# Patient Record
Sex: Female | Born: 1935 | Race: White | Hispanic: No | Marital: Married | Smoking: Former smoker
Health system: Southern US, Community
[De-identification: ages and names within clinical notes are randomized; demographics above are authoritative.]

## PROBLEM LIST (undated history)

## (undated) DIAGNOSIS — K219 Gastro-esophageal reflux disease without esophagitis: Secondary | ICD-10-CM

## (undated) DIAGNOSIS — I10 Essential (primary) hypertension: Secondary | ICD-10-CM

## (undated) DIAGNOSIS — M25552 Pain in left hip: Secondary | ICD-10-CM

## (undated) DIAGNOSIS — F32A Depression, unspecified: Secondary | ICD-10-CM

## (undated) DIAGNOSIS — F329 Major depressive disorder, single episode, unspecified: Secondary | ICD-10-CM

## (undated) DIAGNOSIS — E785 Hyperlipidemia, unspecified: Secondary | ICD-10-CM

## (undated) DIAGNOSIS — E119 Type 2 diabetes mellitus without complications: Secondary | ICD-10-CM

## (undated) HISTORY — PX: HIATAL HERNIA REPAIR: SHX195

## (undated) HISTORY — PX: ABDOMINAL HYSTERECTOMY: SHX81

## (undated) HISTORY — PX: KNEE ARTHROSCOPY: SUR90

## (undated) HISTORY — PX: CHOLECYSTECTOMY: SHX55

## (undated) HISTORY — PX: PARTIAL COLECTOMY: SHX5273

## (undated) HISTORY — PX: SHOULDER ARTHROSCOPY: SHX128

## (undated) HISTORY — PX: COLONOSCOPY: SHX174

## (undated) HISTORY — PX: COLONOSCOPY WITH ESOPHAGOGASTRODUODENOSCOPY (EGD): SHX5779

---

## 2003-06-10 ENCOUNTER — Other Ambulatory Visit: Payer: Self-pay

## 2004-09-05 ENCOUNTER — Ambulatory Visit: Payer: Self-pay | Admitting: Internal Medicine

## 2005-02-20 ENCOUNTER — Ambulatory Visit: Payer: Self-pay | Admitting: Internal Medicine

## 2005-07-04 ENCOUNTER — Ambulatory Visit: Payer: Self-pay | Admitting: Gastroenterology

## 2005-10-18 ENCOUNTER — Ambulatory Visit: Payer: Self-pay | Admitting: Internal Medicine

## 2006-03-19 ENCOUNTER — Ambulatory Visit: Payer: Self-pay | Admitting: Internal Medicine

## 2006-04-11 ENCOUNTER — Ambulatory Visit: Payer: Self-pay | Admitting: Internal Medicine

## 2007-04-25 ENCOUNTER — Ambulatory Visit: Payer: Self-pay | Admitting: Nurse Practitioner

## 2007-06-25 ENCOUNTER — Other Ambulatory Visit: Payer: Self-pay

## 2007-06-26 ENCOUNTER — Inpatient Hospital Stay: Payer: Self-pay | Admitting: General Surgery

## 2008-05-04 ENCOUNTER — Ambulatory Visit: Payer: Self-pay | Admitting: Nurse Practitioner

## 2008-12-01 IMAGING — MG MM CAD SCREENING MAMMO
1 series · 4 of 4 positions shown · non-contrast
Comparison: none

REASON FOR EXAM: scr mammo
COMMENTS:

[Series 8407: R CC · right · 4 of 4 slices shown]
[im 1/4]
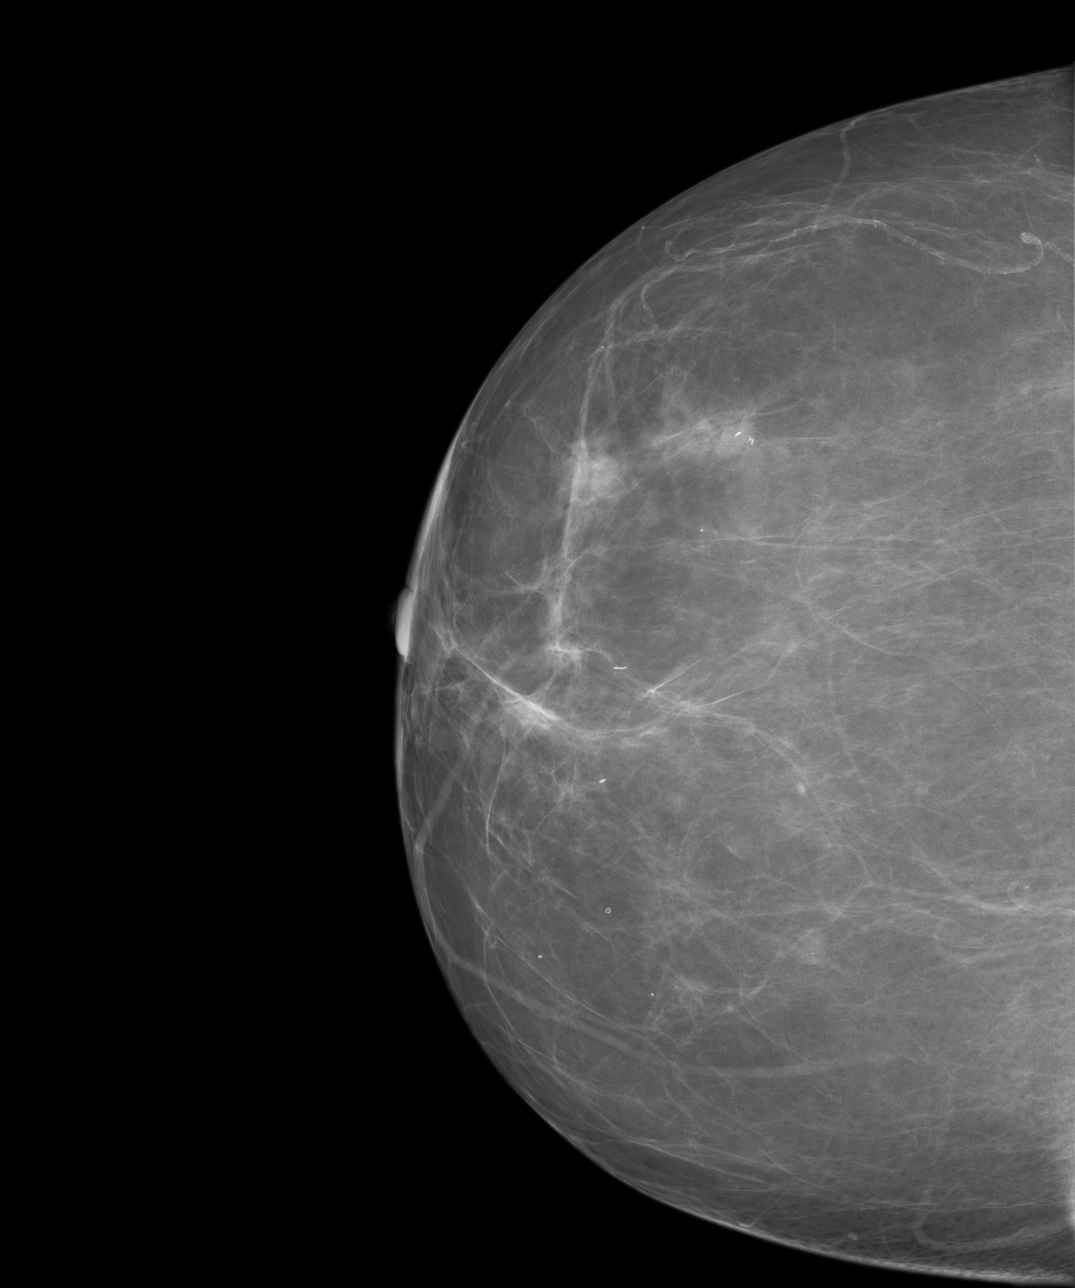
[im 2/4]
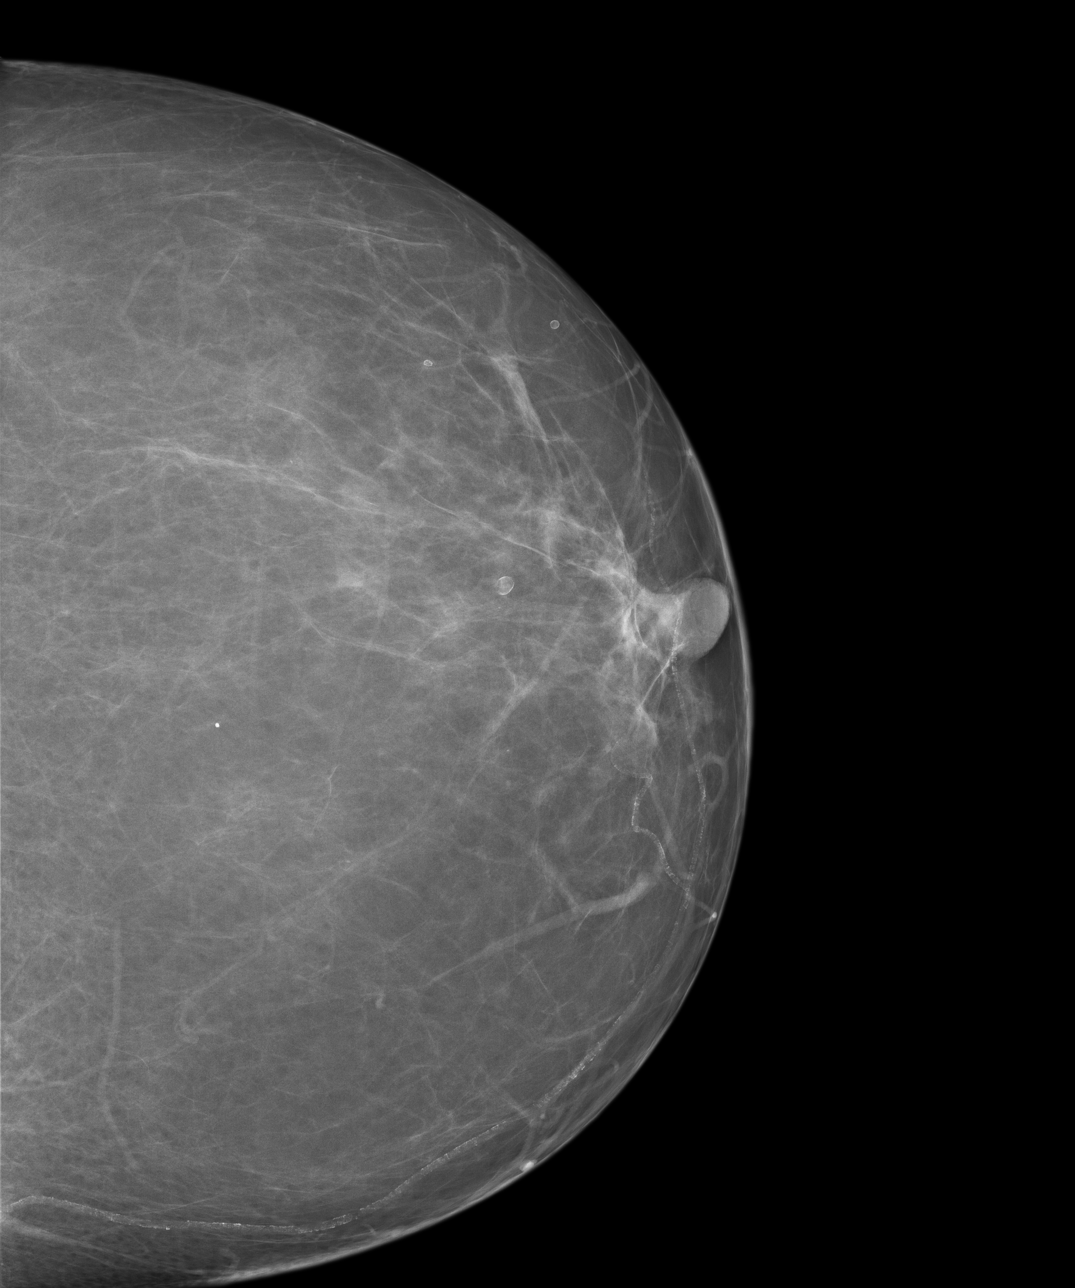
[im 3/4]
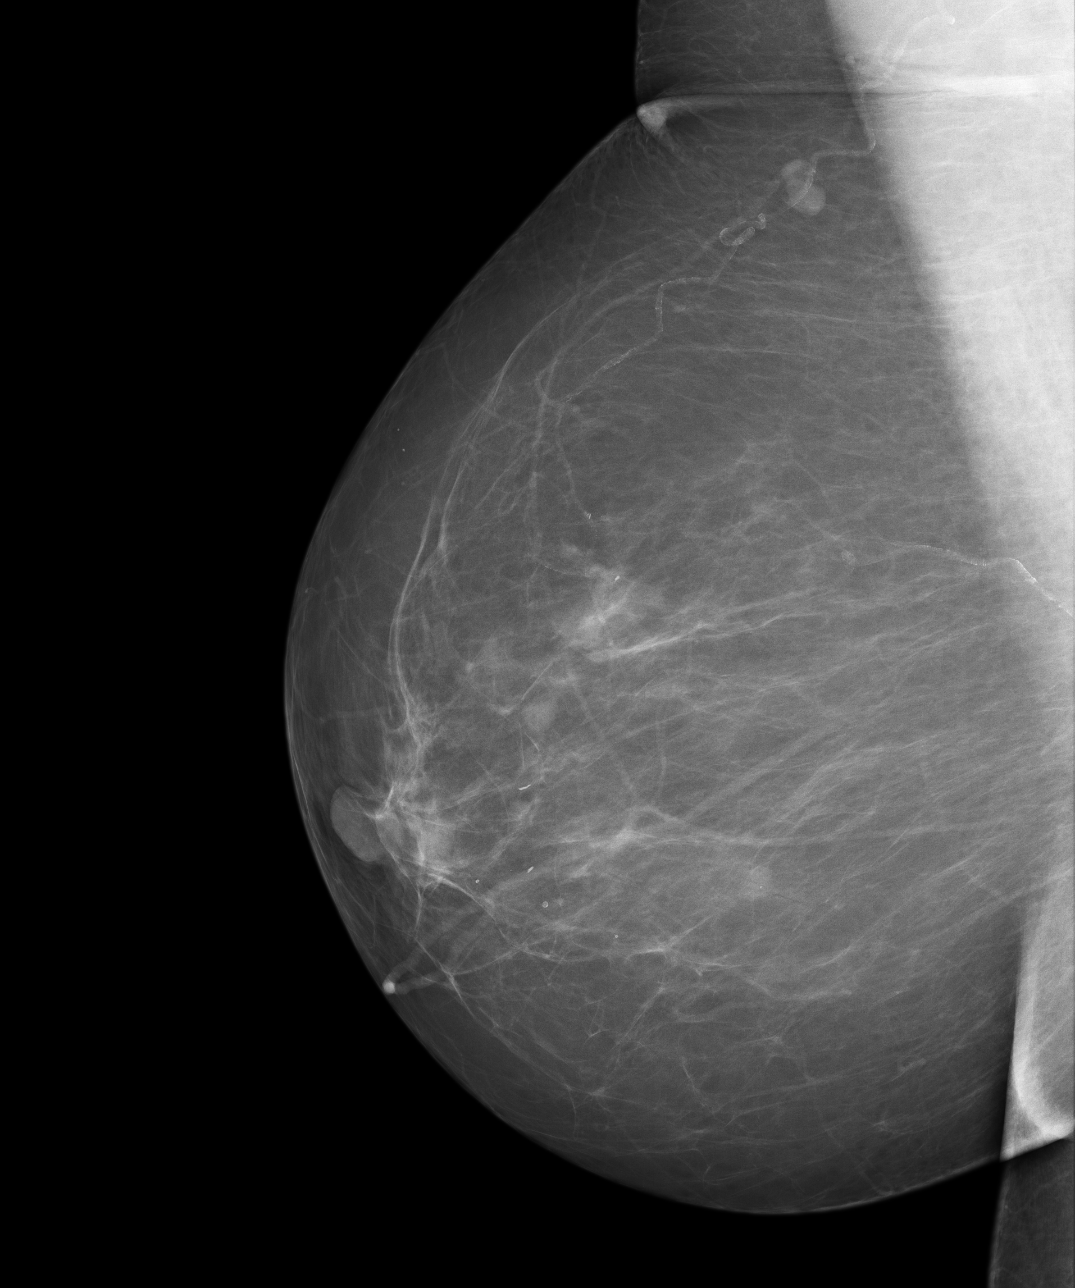
[im 4/4]
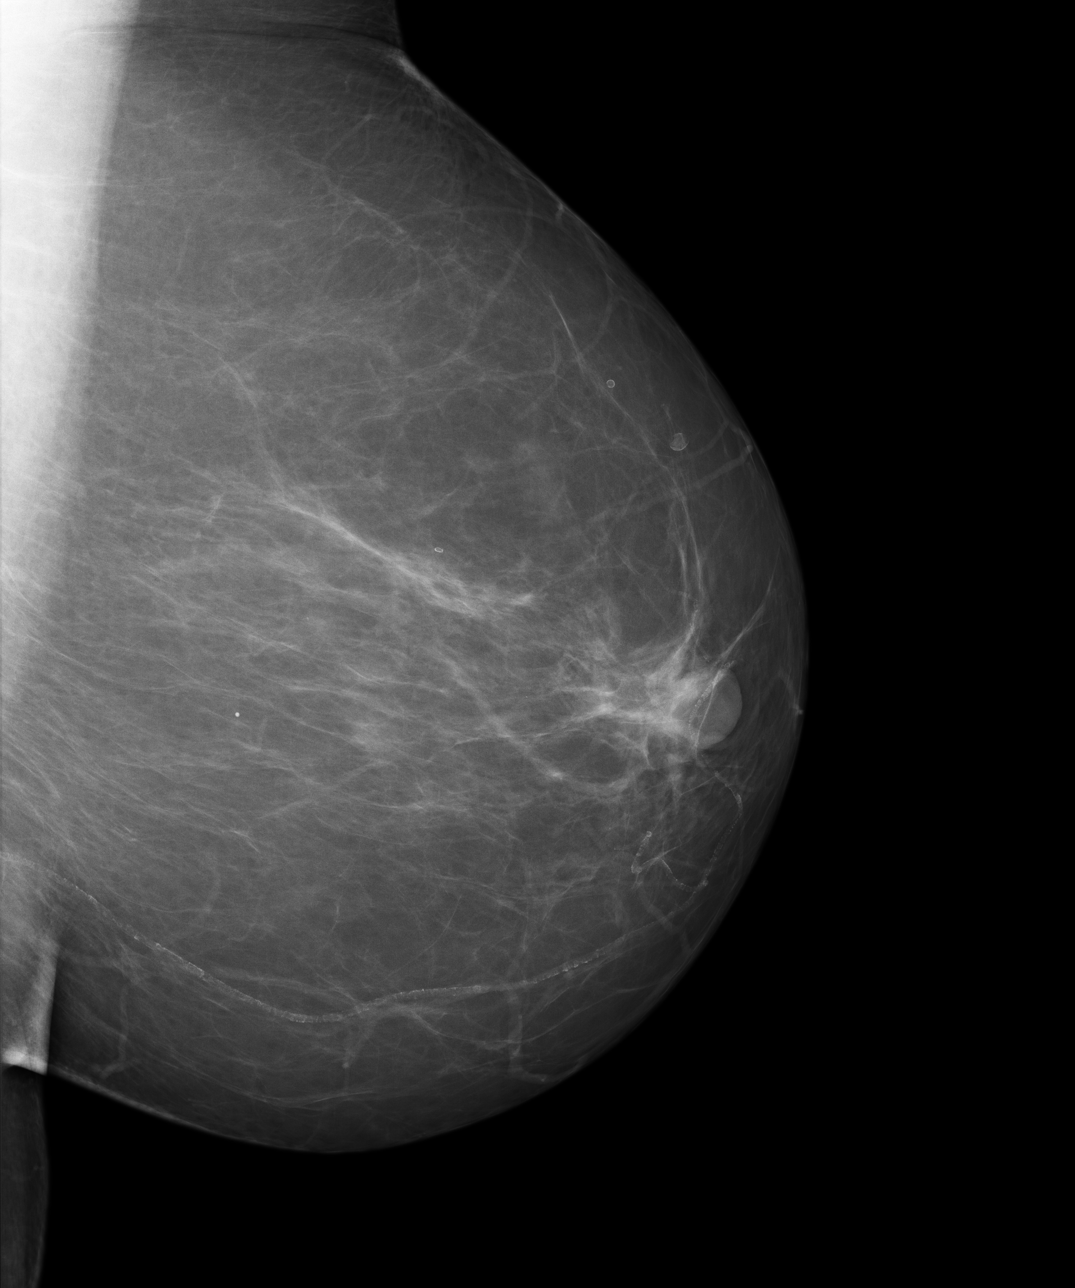

[4 of 4 positions shown; findings below may reference images not displayed]

PROCEDURE:     MAM - MAM DGTL SCREENING MAMMO W/CAD  - April 25, 2007  [DATE]

RESULT:     Comparison is made to a prior study 20 February, 2005 as well as
to a film screen study 19 March, 2006.
The breasts exhibit a symmetric moderately dense parenchymal pattern.  There
is stable density on the RIGHT in the upper outer quadrant. Nodularity in
the axillary region on the RIGHT is present and stable.  There are a few
linear secretory type calcifications present on the RIGHT.  I see no
dominant mass and no malignant appearing grouping of microcalcification.
IMPRESSION: 1.     I do not see findings suspicious for malignancy.
2.     BI-RADS:  Category 2-Benign Finding.

RECOMMENDATION:  Please continue to encourage yearly mammographic followup.

A NEGATIVE MAMMOGRAM REPORT DOES NOT PRECLUDE BIOPSY OR OTHER EVALUATION OF
A CLINICALLY PALPABLE OR OTHERWISE SUPSICIOUS MASS OR LESION.  BREAST CANCER
MAY NOT BE DETECTED BY MAMMOGRAPHY IN UP TO 10% OF CASES.

## 2009-05-10 ENCOUNTER — Ambulatory Visit: Payer: Self-pay | Admitting: Nurse Practitioner

## 2010-05-12 ENCOUNTER — Ambulatory Visit: Payer: Self-pay | Admitting: Nurse Practitioner

## 2010-10-24 ENCOUNTER — Ambulatory Visit: Payer: Self-pay | Admitting: Unknown Physician Specialty

## 2010-11-22 ENCOUNTER — Ambulatory Visit: Payer: Self-pay | Admitting: Family Medicine

## 2010-11-24 ENCOUNTER — Ambulatory Visit: Payer: Self-pay | Admitting: Unknown Physician Specialty

## 2010-11-28 ENCOUNTER — Ambulatory Visit: Payer: Self-pay | Admitting: Unknown Physician Specialty

## 2011-05-23 ENCOUNTER — Ambulatory Visit: Payer: Self-pay

## 2012-05-29 ENCOUNTER — Ambulatory Visit: Payer: Self-pay | Admitting: Family Medicine

## 2013-03-14 ENCOUNTER — Ambulatory Visit: Payer: Self-pay | Admitting: Family Medicine

## 2013-06-04 ENCOUNTER — Ambulatory Visit: Payer: Self-pay | Admitting: Family Medicine

## 2014-06-16 ENCOUNTER — Ambulatory Visit: Payer: Self-pay | Admitting: Pediatrics

## 2015-05-25 ENCOUNTER — Other Ambulatory Visit: Payer: Self-pay | Admitting: Family

## 2015-05-25 ENCOUNTER — Other Ambulatory Visit: Payer: Self-pay | Admitting: Pediatrics

## 2015-05-25 DIAGNOSIS — Z1231 Encounter for screening mammogram for malignant neoplasm of breast: Secondary | ICD-10-CM

## 2015-06-22 ENCOUNTER — Ambulatory Visit
Admission: RE | Admit: 2015-06-22 | Discharge: 2015-06-22 | Disposition: A | Discharge status: Home / Self Care | Payer: Medicare Other | Source: Ambulatory Visit | Attending: Family | Admitting: Family

## 2015-06-22 DIAGNOSIS — Z1231 Encounter for screening mammogram for malignant neoplasm of breast: Secondary | ICD-10-CM | POA: Insufficient documentation

## 2015-09-20 ENCOUNTER — Other Ambulatory Visit
Admission: RE | Admit: 2015-09-20 | Discharge: 2015-09-20 | Disposition: A | Discharge status: Home / Self Care | Payer: Medicare Other | Source: Ambulatory Visit | Attending: Gastroenterology | Admitting: Gastroenterology

## 2015-09-20 DIAGNOSIS — K529 Noninfective gastroenteritis and colitis, unspecified: Secondary | ICD-10-CM | POA: Insufficient documentation

## 2015-09-20 LAB — GASTROINTESTINAL PANEL BY PCR, STOOL (REPLACES STOOL CULTURE)
ADENOVIRUS F40/41: NOT DETECTED
ASTROVIRUS: NOT DETECTED
CAMPYLOBACTER SPECIES: NOT DETECTED
Cryptosporidium: NOT DETECTED
Cyclospora cayetanensis: NOT DETECTED
E. coli O157: NOT DETECTED
ENTAMOEBA HISTOLYTICA: NOT DETECTED
Enteroaggregative E coli (EAEC): NOT DETECTED
Enteropathogenic E coli (EPEC): NOT DETECTED
Enterotoxigenic E coli (ETEC): NOT DETECTED
GIARDIA LAMBLIA: NOT DETECTED
NOROVIRUS GI/GII: NOT DETECTED
PLESIMONAS SHIGELLOIDES: NOT DETECTED
Rotavirus A: NOT DETECTED
SHIGELLA/ENTEROINVASIVE E COLI (EIEC): NOT DETECTED
Salmonella species: NOT DETECTED
Sapovirus (I, II, IV, and V): NOT DETECTED
Shiga like toxin producing E coli (STEC): NOT DETECTED
VIBRIO CHOLERAE: NOT DETECTED
Vibrio species: NOT DETECTED
Yersinia enterocolitica: NOT DETECTED

## 2015-11-15 ENCOUNTER — Other Ambulatory Visit: Payer: Self-pay | Admitting: Orthopedic Surgery

## 2015-11-15 DIAGNOSIS — M5417 Radiculopathy, lumbosacral region: Secondary | ICD-10-CM

## 2015-11-15 DIAGNOSIS — M5441 Lumbago with sciatica, right side: Secondary | ICD-10-CM

## 2015-11-23 ENCOUNTER — Ambulatory Visit: Admission: RE | Admit: 2015-11-23 | Payer: Medicare Other | Source: Ambulatory Visit

## 2015-11-23 ENCOUNTER — Ambulatory Visit
Admission: RE | Admit: 2015-11-23 | Discharge: 2015-11-23 | Disposition: A | Discharge status: Home / Self Care | Payer: Medicare Other | Source: Ambulatory Visit | Attending: Orthopedic Surgery | Admitting: Orthopedic Surgery

## 2015-11-23 DIAGNOSIS — M5441 Lumbago with sciatica, right side: Secondary | ICD-10-CM | POA: Insufficient documentation

## 2015-11-23 DIAGNOSIS — M47896 Other spondylosis, lumbar region: Secondary | ICD-10-CM | POA: Diagnosis not present

## 2015-11-23 DIAGNOSIS — M5417 Radiculopathy, lumbosacral region: Secondary | ICD-10-CM

## 2015-11-23 DIAGNOSIS — K5730 Diverticulosis of large intestine without perforation or abscess without bleeding: Secondary | ICD-10-CM | POA: Diagnosis not present

## 2015-11-23 DIAGNOSIS — N281 Cyst of kidney, acquired: Secondary | ICD-10-CM | POA: Insufficient documentation

## 2015-11-23 DIAGNOSIS — M5137 Other intervertebral disc degeneration, lumbosacral region: Secondary | ICD-10-CM | POA: Diagnosis not present

## 2016-03-27 ENCOUNTER — Encounter: Payer: Self-pay | Admitting: *Deleted

## 2016-03-28 ENCOUNTER — Encounter
Admission: RE | Disposition: A | Discharge status: Home / Self Care | Payer: Self-pay | Source: Ambulatory Visit | Attending: Gastroenterology

## 2016-03-28 ENCOUNTER — Ambulatory Visit: Payer: Medicare Other | Admitting: Anesthesiology

## 2016-03-28 ENCOUNTER — Encounter: Payer: Self-pay | Admitting: *Deleted

## 2016-03-28 ENCOUNTER — Ambulatory Visit
Admission: RE | Admit: 2016-03-28 | Discharge: 2016-03-28 | Disposition: A | Discharge status: Home / Self Care | Payer: Medicare Other | Source: Ambulatory Visit | Attending: Gastroenterology | Admitting: Gastroenterology

## 2016-03-28 DIAGNOSIS — E119 Type 2 diabetes mellitus without complications: Secondary | ICD-10-CM | POA: Insufficient documentation

## 2016-03-28 DIAGNOSIS — F329 Major depressive disorder, single episode, unspecified: Secondary | ICD-10-CM | POA: Insufficient documentation

## 2016-03-28 DIAGNOSIS — Z980 Intestinal bypass and anastomosis status: Secondary | ICD-10-CM | POA: Diagnosis not present

## 2016-03-28 DIAGNOSIS — Z9049 Acquired absence of other specified parts of digestive tract: Secondary | ICD-10-CM | POA: Insufficient documentation

## 2016-03-28 DIAGNOSIS — I10 Essential (primary) hypertension: Secondary | ICD-10-CM | POA: Insufficient documentation

## 2016-03-28 DIAGNOSIS — Z7982 Long term (current) use of aspirin: Secondary | ICD-10-CM | POA: Diagnosis not present

## 2016-03-28 DIAGNOSIS — E785 Hyperlipidemia, unspecified: Secondary | ICD-10-CM | POA: Insufficient documentation

## 2016-03-28 DIAGNOSIS — Z8371 Family history of colonic polyps: Secondary | ICD-10-CM | POA: Diagnosis not present

## 2016-03-28 DIAGNOSIS — Z7984 Long term (current) use of oral hypoglycemic drugs: Secondary | ICD-10-CM | POA: Diagnosis not present

## 2016-03-28 DIAGNOSIS — R197 Diarrhea, unspecified: Secondary | ICD-10-CM | POA: Diagnosis present

## 2016-03-28 DIAGNOSIS — Z87891 Personal history of nicotine dependence: Secondary | ICD-10-CM | POA: Diagnosis not present

## 2016-03-28 DIAGNOSIS — D122 Benign neoplasm of ascending colon: Secondary | ICD-10-CM | POA: Diagnosis not present

## 2016-03-28 DIAGNOSIS — K219 Gastro-esophageal reflux disease without esophagitis: Secondary | ICD-10-CM | POA: Diagnosis not present

## 2016-03-28 DIAGNOSIS — K5730 Diverticulosis of large intestine without perforation or abscess without bleeding: Secondary | ICD-10-CM | POA: Insufficient documentation

## 2016-03-28 HISTORY — DX: Hyperlipidemia, unspecified: E78.5

## 2016-03-28 HISTORY — DX: Pain in left hip: M25.552

## 2016-03-28 HISTORY — DX: Essential (primary) hypertension: I10

## 2016-03-28 HISTORY — DX: Depression, unspecified: F32.A

## 2016-03-28 HISTORY — DX: Major depressive disorder, single episode, unspecified: F32.9

## 2016-03-28 HISTORY — DX: Gastro-esophageal reflux disease without esophagitis: K21.9

## 2016-03-28 HISTORY — PX: COLONOSCOPY WITH PROPOFOL: SHX5780

## 2016-03-28 HISTORY — DX: Type 2 diabetes mellitus without complications: E11.9

## 2016-03-28 LAB — GLUCOSE, CAPILLARY: Glucose-Capillary: 190 mg/dL — ABNORMAL HIGH (ref 65–99)

## 2016-03-28 SURGERY — COLONOSCOPY WITH PROPOFOL
Anesthesia: General

## 2016-03-28 MED ORDER — MIDAZOLAM HCL 5 MG/5ML IJ SOLN
INTRAMUSCULAR | Status: DC | PRN
  Administered 2016-03-28: 1 mg via INTRAVENOUS

## 2016-03-28 MED ORDER — FENTANYL CITRATE (PF) 100 MCG/2ML IJ SOLN
INTRAMUSCULAR | Status: DC | PRN
  Administered 2016-03-28: 50 ug via INTRAVENOUS

## 2016-03-28 MED ORDER — SODIUM CHLORIDE 0.9 % IV SOLN: INTRAVENOUS | Status: DC

## 2016-03-28 MED ORDER — SODIUM CHLORIDE 0.9 % IV SOLN
INTRAVENOUS | Status: DC
  Administered 2016-03-28: 11:00:00 via INTRAVENOUS

## 2016-03-28 MED ORDER — PROPOFOL 10 MG/ML IV BOLUS
INTRAVENOUS | Status: DC | PRN
  Administered 2016-03-28: 80 mg via INTRAVENOUS

## 2016-03-28 MED ORDER — LIDOCAINE 2% (20 MG/ML) 5 ML SYRINGE
INTRAMUSCULAR | Status: DC | PRN
  Administered 2016-03-28: 30 mg via INTRAVENOUS

## 2016-03-28 MED ORDER — PHENYLEPHRINE HCL 10 MG/ML IJ SOLN
INTRAMUSCULAR | Status: DC | PRN
  Administered 2016-03-28 (×2): 100 ug via INTRAVENOUS

## 2016-03-28 MED ORDER — PROPOFOL 500 MG/50ML IV EMUL
INTRAVENOUS | Status: DC | PRN
  Administered 2016-03-28: 100 ug/kg/min via INTRAVENOUS

## 2016-03-28 NOTE — Anesthesia Preprocedure Evaluation (Signed)
Anesthesia Evaluation  Patient identified by MRN, date of birth, ID band Patient awake    Reviewed: Allergy & Precautions, H&P , NPO status , Patient's Chart, lab work & pertinent test results, reviewed documented beta blocker date and time   Airway Mallampati: II   Neck ROM: full    Dental  (+) Poor Dentition   Pulmonary neg pulmonary ROS, former smoker,    Pulmonary exam normal        Cardiovascular hypertension, negative cardio ROS Normal cardiovascular exam     Neuro/Psych PSYCHIATRIC DISORDERS negative neurological ROS  negative psych ROS   GI/Hepatic negative GI ROS, Neg liver ROS, GERD  Medicated,  Endo/Other  negative endocrine ROSdiabetes  Renal/GU negative Renal ROS  negative genitourinary   Musculoskeletal   Abdominal   Peds  Hematology negative hematology ROS (+)   Anesthesia Other Findings Past Medical History: No date: Depression No date: Diabetes mellitus without complication (HCC) No date: GERD (gastroesophageal reflux disease) No date: Hyperlipidemia No date: Hypertension No date: Pain in left hip Past Surgical History: No date: ABDOMINAL HYSTERECTOMY No date: CHOLECYSTECTOMY No date: COLONOSCOPY No date: COLONOSCOPY WITH ESOPHAGOGASTRODUODENOSCOPY (E* No date: HIATAL HERNIA REPAIR No date: KNEE ARTHROSCOPY No date: PARTIAL COLECTOMY No date: SHOULDER ARTHROSCOPY BMI    Body Mass Index:  25.58 kg/m     Reproductive/Obstetrics                             Anesthesia Physical Anesthesia Plan  ASA: III  Anesthesia Plan: General   Post-op Pain Management:    Induction:   Airway Management Planned:   Additional Equipment:   Intra-op Plan:   Post-operative Plan:   Informed Consent: I have reviewed the patients History and Physical, chart, labs and discussed the procedure including the risks, benefits and alternatives for the proposed anesthesia with the  patient or authorized representative who has indicated his/her understanding and acceptance.   Dental Advisory Given  Plan Discussed with: CRNA  Anesthesia Plan Comments:         Anesthesia Quick Evaluation

## 2016-03-28 NOTE — Op Note (Signed)
[facility] Gastroenterology Patient Name: Teresa Gibson Procedure Date: 03/28/2016 11:08 AM MRN: [id number] Account #: [id number] Date of Birth: Jul 24, 1933 Admit Type: Outpatient Age: 82 Room: [facility] ENDO ROOM 3 Gender: Female Note Status: Finalized Procedure:            Colonoscopy Indications:          Chronic diarrhea Providers:            Lollie Sails, MD Referring MD:         Warren Danes (Referring MD) Medicines:            Monitored Anesthesia Care Complications:        No immediate complications. Procedure:            Pre-Anesthesia Assessment:                       - ASA Grade Assessment: III - A patient with severe                        systemic disease.                       After obtaining informed consent, the colonoscope was                        passed under direct vision. Throughout the procedure,                        the patient's blood pressure, pulse, and oxygen                        saturations were monitored continuously. The                        Colonoscope was introduced through the anus and                        advanced to the the cecum, identified by appendiceal                        orifice and ileocecal valve. The colonoscopy was                        performed with moderate difficulty due to multiple                        diverticula in the colon. The patient tolerated the                        procedure well. The quality of the bowel preparation                        was good. Findings:      Two sessile polyps were found in the ascending colon. The polyps were 2       to 3 mm in size. These polyps were removed with a cold biopsy forceps.       Resection and retrieval were complete.      Multiple small and large-mouthed diverticula were found in the sigmoid       colon, descending colon, transverse colon and ascending colon.  There was evidence of a prior functional end-to-end colo-colonic   anastomosis at 9 cm proximal to the anus. This was patent and was       characterized by healthy appearing mucosa. The anastomosis was traversed.      The digital rectal exam was normal.      The retroflexed view of the distal rectum and anal verge was normal and       showed no anal or rectal abnormalities. Impression:           - Two 2 to 3 mm polyps in the ascending colon, removed                        with a cold biopsy forceps. Resected and retrieved.                       - Diverticulosis in the sigmoid colon, in the                        descending colon, in the transverse colon and in the                        ascending colon.                       - Patent functional end-to-end colo-colonic                        anastomosis, characterized by healthy appearing mucosa.                       - The distal rectum and anal verge are normal on                        retroflexion view. Recommendation:       - Discharge patient to home. Procedure Code(s):    --- Professional ---                       [phone], Colonoscopy, flexible; with biopsy, single or                        multiple Diagnosis Code(s):    --- Professional ---                       D12.2, Benign neoplasm of ascending colon                       Z98.0, Intestinal bypass and anastomosis status                       K52.9, Noninfective gastroenteritis and colitis,                        unspecified                       K57.30, Diverticulosis of large intestine without                        perforation or abscess without bleeding CPT copyright 2016 American [employer]. All rights reserved. The codes documented in this report are preliminary and upon coder review may  be revised to meet current compliance requirements. Lollie Sails, MD 03/28/2016 11:46:44 AM This report has been signed electronically. Number of Addenda: 0 Note Initiated On: 03/28/2016 11:08 AM Scope Withdrawal Time: 0 hours 18 minutes 27  seconds  Total Procedure Duration: 0 hours 25 minutes 56 seconds       [facility]

## 2016-03-28 NOTE — H&P (Signed)
Outpatient short stay form Pre-procedure 03/28/2016 11:04 AM Teresa Sails MD  Primary Physician: Dr. Trecia Rogers  Reason for visit:  Colonoscopy  History of present illness:  Patient is a 82 year old female presenting today as above. She has a personal history of diarrhea seems to be getting worse, this seems to of started when she had a surgery in 2009 for perforated diverticulum. This required a colostomy at the time then followed by reversal. She does occasionally see some bright red blood with her stool. She does take 81 mg aspirin (held that for several days. She takes no other aspirin products or blood thinning agents. She tolerated her prep well.  Along with her loose stools or some occasional fecal incontinence. Further she has a history of taking metformin, also a history of cholecystectomy about 10-15 years ago. She states that her abdominal pain is across the lower abdomen but much more so in the right lower quadrant.    Current Facility-Administered Medications:  .  0.9 %  sodium chloride infusion, , Intravenous, Continuous, Teresa Sails, MD, Last Rate: 20 mL/hr at 03/28/16 1041 .  0.9 %  sodium chloride infusion, , Intravenous, Continuous, Teresa Sails, MD  Prescriptions Prior to Admission  Medication Sig Dispense Refill Last Dose  . aspirin 81 MG chewable tablet Chew by mouth daily.   03/25/2016  . atorvastatin (LIPITOR) 10 MG tablet Take 10 mg by mouth daily.     Marland Kitchen dicyclomine (BENTYL) 10 MG capsule Take 10 mg by mouth 3 (three) times daily as needed for spasms.     Marland Kitchen gabapentin (NEURONTIN) 100 MG capsule Take 100 mg by mouth at bedtime.     Marland Kitchen glipiZIDE (GLUCOTROL) 5 MG tablet Take by mouth daily before breakfast.   03/27/2016 at Unknown time  . hydrochlorothiazide (HYDRODIURIL) 25 MG tablet Take 25 mg by mouth daily.     Marland Kitchen lisinopril (PRINIVIL,ZESTRIL) 20 MG tablet Take 20 mg by mouth daily.   03/28/2016 at 0730  . LORazepam (ATIVAN) 0.5 MG tablet Take  0.5 mg by mouth as needed for anxiety.   03/25/2016  . meloxicam (MOBIC) 7.5 MG tablet Take 7.5 mg by mouth daily.     . metFORMIN (GLUCOPHAGE) 1000 MG tablet Take 1,000 mg by mouth 2 (two) times daily with a meal.     . metoprolol tartrate (LOPRESSOR) 25 MG tablet Take 25 mg by mouth 2 (two) times daily.   03/28/2016 at 0730  . Multiple Vitamin (MULTIVITAMIN) tablet Take 1 tablet by mouth daily.     Marland Kitchen omeprazole (PRILOSEC) 20 MG capsule Take 20 mg by mouth daily.   03/26/2016  . traMADol (ULTRAM) 50 MG tablet Take by mouth every 6 (six) hours as needed.   03/24/2016     Allergies  Allergen Reactions  . Contrast Media [Iodinated Diagnostic Agents]      Past Medical History:  Diagnosis Date  . Depression   . Diabetes mellitus without complication ([place])   . GERD (gastroesophageal reflux disease)   . Hyperlipidemia   . Hypertension   . Pain in left hip     Review of systems:      Physical Exam    Heart and lungs: Regular rate and rhythm without rub or gallop, lungs are bilaterally clear.    HEENT: Normocephalic atraumatic eyes are anicteric    Other: Soft nontender nondistended bowel sounds positive normoactive    Pertinant exam for procedure: Soft nontender nondistended bowel sounds positive normoactive.  Planned proceedures:  Colonoscopy and indicated procedures. I have discussed the risks benefits and complications of procedures to include not limited to bleeding, infection, perforation and the risk of sedation and the patient wishes to proceed.    Teresa Sails, MD Gastroenterology 03/28/2016  11:04 AM

## 2016-03-28 NOTE — Transfer of Care (Signed)
Immediate Anesthesia Transfer of Care Note  Patient: Teresa Gibson  Procedure(s) Performed: Procedure(s): COLONOSCOPY WITH PROPOFOL (N/A)  Patient Location: PACU and Endoscopy Unit  Anesthesia Type:General  Level of Consciousness: oriented  Airway & Oxygen Therapy: Patient Spontanous Breathing and Patient connected to nasal cannula oxygen  Post-op Assessment: Report given to RN and Post -op Vital signs reviewed and stable  Post vital signs: Reviewed and stable  Last Vitals:  Vitals:   03/28/16 1017  BP: (!) 185/95  Pulse: (!) 104  Resp: 18  Temp: 36.7 C    Last Pain:  Vitals:   03/28/16 1017  TempSrc: Tympanic         Complications: No apparent anesthesia complications

## 2016-03-29 ENCOUNTER — Encounter: Payer: Self-pay | Admitting: Gastroenterology

## 2016-03-29 LAB — SURGICAL PATHOLOGY

## 2016-03-30 NOTE — Anesthesia Postprocedure Evaluation (Signed)
Anesthesia Post Note  Patient: Teresa Gibson  Procedure(s) Performed: Procedure(s) (LRB): COLONOSCOPY WITH PROPOFOL (N/A)  Patient location during evaluation: PACU Anesthesia Type: General Level of consciousness: awake and alert Pain management: pain level controlled Vital Signs Assessment: post-procedure vital signs reviewed and stable Respiratory status: spontaneous breathing, nonlabored ventilation, respiratory function stable and patient connected to nasal cannula oxygen Cardiovascular status: blood pressure returned to baseline and stable Postop Assessment: no signs of nausea or vomiting Anesthetic complications: no    Last Vitals:  Vitals:   03/28/16 1206 03/28/16 1217  BP: 134/79 139/81  Pulse: 77 (!) 101  Resp: 15 (!) 21  Temp:      Last Pain:  Vitals:   03/29/16 0800  TempSrc:   PainSc: 0-No pain                 Molli Barrows

## 2016-06-07 ENCOUNTER — Other Ambulatory Visit: Payer: Self-pay | Admitting: Family

## 2016-06-07 DIAGNOSIS — Z1239 Encounter for other screening for malignant neoplasm of breast: Secondary | ICD-10-CM

## 2017-05-24 ENCOUNTER — Ambulatory Visit
Admission: RE | Admit: 2017-05-24 | Discharge: 2017-05-24 | Disposition: A | Discharge status: Home / Self Care | Payer: Medicare Other | Source: Ambulatory Visit | Attending: Family | Admitting: Family

## 2017-05-24 DIAGNOSIS — Z1231 Encounter for screening mammogram for malignant neoplasm of breast: Secondary | ICD-10-CM | POA: Insufficient documentation

## 2017-05-24 DIAGNOSIS — Z1239 Encounter for other screening for malignant neoplasm of breast: Secondary | ICD-10-CM

## 2017-08-09 ENCOUNTER — Other Ambulatory Visit
Admission: RE | Admit: 2017-08-09 | Discharge: 2017-08-09 | Disposition: A | Discharge status: Home / Self Care | Payer: Medicare Other | Source: Ambulatory Visit | Attending: Gastroenterology | Admitting: Gastroenterology

## 2017-08-09 DIAGNOSIS — R197 Diarrhea, unspecified: Secondary | ICD-10-CM | POA: Insufficient documentation

## 2017-08-09 LAB — GASTROINTESTINAL PANEL BY PCR, STOOL (REPLACES STOOL CULTURE)
ADENOVIRUS F40/41: NOT DETECTED
ASTROVIRUS: NOT DETECTED
CAMPYLOBACTER SPECIES: NOT DETECTED
CYCLOSPORA CAYETANENSIS: NOT DETECTED
Cryptosporidium: NOT DETECTED
ENTEROPATHOGENIC E COLI (EPEC): NOT DETECTED
ENTEROTOXIGENIC E COLI (ETEC): NOT DETECTED
Entamoeba histolytica: NOT DETECTED
Enteroaggregative E coli (EAEC): NOT DETECTED
Giardia lamblia: NOT DETECTED
Norovirus GI/GII: NOT DETECTED
Plesimonas shigelloides: NOT DETECTED
ROTAVIRUS A: NOT DETECTED
SAPOVIRUS (I, II, IV, AND V): NOT DETECTED
SHIGA LIKE TOXIN PRODUCING E COLI (STEC): NOT DETECTED
Salmonella species: NOT DETECTED
Shigella/Enteroinvasive E coli (EIEC): NOT DETECTED
Vibrio cholerae: NOT DETECTED
Vibrio species: NOT DETECTED
Yersinia enterocolitica: NOT DETECTED

## 2017-08-09 LAB — C DIFFICILE QUICK SCREEN W PCR REFLEX
C DIFFICILE (CDIFF) INTERP: NOT DETECTED
C DIFFICLE (CDIFF) ANTIGEN: NEGATIVE
C Diff toxin: NEGATIVE

## 2017-08-20 ENCOUNTER — Other Ambulatory Visit: Payer: Self-pay | Admitting: Gastroenterology

## 2017-08-20 DIAGNOSIS — R1031 Right lower quadrant pain: Secondary | ICD-10-CM

## 2017-08-20 DIAGNOSIS — R1032 Left lower quadrant pain: Principal | ICD-10-CM

## 2017-08-21 ENCOUNTER — Ambulatory Visit
Admission: RE | Admit: 2017-08-21 | Discharge: 2017-08-21 | Disposition: A | Discharge status: Home / Self Care | Payer: Medicare Other | Source: Ambulatory Visit | Attending: Gastroenterology | Admitting: Gastroenterology

## 2017-08-21 ENCOUNTER — Other Ambulatory Visit: Payer: Self-pay | Admitting: Gastroenterology

## 2017-08-21 DIAGNOSIS — R1032 Left lower quadrant pain: Principal | ICD-10-CM

## 2017-08-21 DIAGNOSIS — R1031 Right lower quadrant pain: Secondary | ICD-10-CM | POA: Insufficient documentation

## 2017-09-19 ENCOUNTER — Encounter: Payer: Self-pay | Admitting: *Deleted

## 2017-09-20 ENCOUNTER — Ambulatory Visit: Payer: Medicare Other | Admitting: Anesthesiology

## 2017-09-20 ENCOUNTER — Encounter
Admission: RE | Disposition: A | Discharge status: Home / Self Care | Payer: Self-pay | Source: Ambulatory Visit | Attending: Gastroenterology

## 2017-09-20 ENCOUNTER — Encounter: Payer: Self-pay | Admitting: *Deleted

## 2017-09-20 ENCOUNTER — Other Ambulatory Visit: Payer: Self-pay

## 2017-09-20 ENCOUNTER — Ambulatory Visit
Admission: RE | Admit: 2017-09-20 | Discharge: 2017-09-20 | Disposition: A | Discharge status: Home / Self Care | Payer: Medicare Other | Source: Ambulatory Visit | Attending: Gastroenterology | Admitting: Gastroenterology

## 2017-09-20 DIAGNOSIS — K210 Gastro-esophageal reflux disease with esophagitis: Secondary | ICD-10-CM | POA: Diagnosis not present

## 2017-09-20 DIAGNOSIS — Z79899 Other long term (current) drug therapy: Secondary | ICD-10-CM | POA: Insufficient documentation

## 2017-09-20 DIAGNOSIS — F329 Major depressive disorder, single episode, unspecified: Secondary | ICD-10-CM | POA: Insufficient documentation

## 2017-09-20 DIAGNOSIS — Z7984 Long term (current) use of oral hypoglycemic drugs: Secondary | ICD-10-CM | POA: Insufficient documentation

## 2017-09-20 DIAGNOSIS — Z888 Allergy status to other drugs, medicaments and biological substances status: Secondary | ICD-10-CM | POA: Insufficient documentation

## 2017-09-20 DIAGNOSIS — Z90710 Acquired absence of both cervix and uterus: Secondary | ICD-10-CM | POA: Diagnosis not present

## 2017-09-20 DIAGNOSIS — D122 Benign neoplasm of ascending colon: Secondary | ICD-10-CM | POA: Diagnosis not present

## 2017-09-20 DIAGNOSIS — K921 Melena: Secondary | ICD-10-CM | POA: Insufficient documentation

## 2017-09-20 DIAGNOSIS — Z7982 Long term (current) use of aspirin: Secondary | ICD-10-CM | POA: Insufficient documentation

## 2017-09-20 DIAGNOSIS — D509 Iron deficiency anemia, unspecified: Secondary | ICD-10-CM | POA: Diagnosis not present

## 2017-09-20 DIAGNOSIS — Z86010 Personal history of colonic polyps: Secondary | ICD-10-CM | POA: Insufficient documentation

## 2017-09-20 DIAGNOSIS — E785 Hyperlipidemia, unspecified: Secondary | ICD-10-CM | POA: Diagnosis not present

## 2017-09-20 DIAGNOSIS — Z87891 Personal history of nicotine dependence: Secondary | ICD-10-CM | POA: Diagnosis not present

## 2017-09-20 DIAGNOSIS — Z980 Intestinal bypass and anastomosis status: Secondary | ICD-10-CM | POA: Insufficient documentation

## 2017-09-20 DIAGNOSIS — Z91041 Radiographic dye allergy status: Secondary | ICD-10-CM | POA: Diagnosis not present

## 2017-09-20 DIAGNOSIS — K449 Diaphragmatic hernia without obstruction or gangrene: Secondary | ICD-10-CM | POA: Insufficient documentation

## 2017-09-20 DIAGNOSIS — D124 Benign neoplasm of descending colon: Secondary | ICD-10-CM | POA: Diagnosis not present

## 2017-09-20 DIAGNOSIS — I10 Essential (primary) hypertension: Secondary | ICD-10-CM | POA: Diagnosis not present

## 2017-09-20 DIAGNOSIS — Z8489 Family history of other specified conditions: Secondary | ICD-10-CM | POA: Diagnosis not present

## 2017-09-20 DIAGNOSIS — Z8371 Family history of colonic polyps: Secondary | ICD-10-CM | POA: Diagnosis not present

## 2017-09-20 DIAGNOSIS — E119 Type 2 diabetes mellitus without complications: Secondary | ICD-10-CM | POA: Diagnosis not present

## 2017-09-20 DIAGNOSIS — K644 Residual hemorrhoidal skin tags: Secondary | ICD-10-CM | POA: Diagnosis not present

## 2017-09-20 DIAGNOSIS — K5750 Diverticulosis of both small and large intestine without perforation or abscess without bleeding: Secondary | ICD-10-CM | POA: Diagnosis not present

## 2017-09-20 DIAGNOSIS — Z9049 Acquired absence of other specified parts of digestive tract: Secondary | ICD-10-CM | POA: Diagnosis not present

## 2017-09-20 DIAGNOSIS — Z8379 Family history of other diseases of the digestive system: Secondary | ICD-10-CM | POA: Insufficient documentation

## 2017-09-20 HISTORY — PX: ESOPHAGOGASTRODUODENOSCOPY (EGD) WITH PROPOFOL: SHX5813

## 2017-09-20 HISTORY — PX: COLONOSCOPY WITH PROPOFOL: SHX5780

## 2017-09-20 LAB — GLUCOSE, CAPILLARY: Glucose-Capillary: 198 mg/dL — ABNORMAL HIGH (ref 65–99)

## 2017-09-20 SURGERY — ESOPHAGOGASTRODUODENOSCOPY (EGD) WITH PROPOFOL
Anesthesia: General

## 2017-09-20 MED ORDER — PROPOFOL 10 MG/ML IV BOLUS
INTRAVENOUS | Status: AC
  Filled 2017-09-20: qty 80

## 2017-09-20 MED ORDER — LIDOCAINE HCL (PF) 2 % IJ SOLN
INTRAMUSCULAR | Status: AC
  Filled 2017-09-20: qty 10

## 2017-09-20 MED ORDER — PHENYLEPHRINE HCL 10 MG/ML IJ SOLN
INTRAMUSCULAR | Status: DC | PRN
  Administered 2017-09-20 (×2): 100 ug via INTRAVENOUS
  Administered 2017-09-20: 200 ug via INTRAVENOUS

## 2017-09-20 MED ORDER — PROPOFOL 500 MG/50ML IV EMUL
INTRAVENOUS | Status: DC | PRN
  Administered 2017-09-20: 100 ug/kg/min via INTRAVENOUS

## 2017-09-20 MED ORDER — SODIUM CHLORIDE 0.9 % IV SOLN: INTRAVENOUS | Status: DC

## 2017-09-20 MED ORDER — SODIUM CHLORIDE 0.9 % IV SOLN
INTRAVENOUS | Status: DC
  Administered 2017-09-20: 08:00:00 via INTRAVENOUS

## 2017-09-20 MED ORDER — METOPROLOL TARTRATE 25 MG PO TABS
ORAL_TABLET | ORAL | Status: AC
  Administered 2017-09-20: 25 mg
  Filled 2017-09-20: qty 1

## 2017-09-20 MED ORDER — PHENYLEPHRINE HCL 10 MG/ML IJ SOLN
INTRAMUSCULAR | Status: AC
  Filled 2017-09-20: qty 1

## 2017-09-20 MED ORDER — PROPOFOL 10 MG/ML IV BOLUS
INTRAVENOUS | Status: AC
  Filled 2017-09-20: qty 40

## 2017-09-20 MED ORDER — LIDOCAINE HCL (CARDIAC) 20 MG/ML IV SOLN
INTRAVENOUS | Status: DC | PRN
  Administered 2017-09-20: 50 mg via INTRAVENOUS

## 2017-09-20 MED ORDER — PROPOFOL 10 MG/ML IV BOLUS
INTRAVENOUS | Status: DC | PRN
  Administered 2017-09-20 (×5): 50 mg via INTRAVENOUS

## 2017-09-20 NOTE — Anesthesia Postprocedure Evaluation (Signed)
Anesthesia Post Note  Patient: Writer  Procedure(s) Performed: ESOPHAGOGASTRODUODENOSCOPY (EGD) WITH PROPOFOL (N/A ) COLONOSCOPY WITH PROPOFOL (N/A )  Patient location during evaluation: Endoscopy Anesthesia Type: General Level of consciousness: awake, awake and alert, oriented and patient cooperative Pain management: satisfactory to patient Vital Signs Assessment: post-procedure vital signs reviewed and stable Respiratory status: spontaneous breathing Cardiovascular status: blood pressure returned to baseline Postop Assessment: no headache, no backache, no apparent nausea or vomiting and adequate PO intake Anesthetic complications: no     Last Vitals:  Vitals:   09/20/17 0805 09/20/17 0939  BP: (!) 165/98 (!) 113/52  Pulse: (!) 111 72  Resp: 16 19  Temp: (!) 35.9 C (!) 36 C  SpO2: 99% 100%    Last Pain:  Vitals:   09/20/17 0939  TempSrc: Tympanic                 Leomar Westberg H Bawi Lakins

## 2017-09-20 NOTE — Anesthesia Postprocedure Evaluation (Signed)
Anesthesia Post Note  Patient: Teresa Gibson  Procedure(s) Performed: ESOPHAGOGASTRODUODENOSCOPY (EGD) WITH PROPOFOL (N/A ) COLONOSCOPY WITH PROPOFOL (N/A )  Patient location during evaluation: Endoscopy Anesthesia Type: General Level of consciousness: awake and alert Pain management: pain level controlled Vital Signs Assessment: post-procedure vital signs reviewed and stable Respiratory status: spontaneous breathing and respiratory function stable Cardiovascular status: stable Anesthetic complications: no     Last Vitals:  Vitals:   09/20/17 0805 09/20/17 0939  BP: (!) 165/98 (!) 113/52  Pulse: (!) 111 72  Resp: 16 19  Temp: (!) 35.9 C (!) 36 C  SpO2: 99% 100%    Last Pain:  Vitals:   09/20/17 0939  TempSrc: Tympanic                 KEPHART,WILLIAM K

## 2017-09-20 NOTE — Anesthesia Preprocedure Evaluation (Signed)
Anesthesia Evaluation  Patient identified by MRN, date of birth, ID band Patient awake    Reviewed: Allergy & Precautions, NPO status , Patient's Chart, lab work & pertinent test results, reviewed documented beta blocker date and time   History of Anesthesia Complications Negative for: history of anesthetic complications  Airway Mallampati: III       Dental  (+) Partial Upper   Pulmonary neg sleep apnea, neg COPD, former smoker,           Cardiovascular hypertension, Pt. on medications and Pt. on home beta blockers (-) Past MI and (-) CHF (-) dysrhythmias (-) Valvular Problems/Murmurs     Neuro/Psych neg Seizures Depression    GI/Hepatic Neg liver ROS, GERD  Medicated and Poorly Controlled,  Endo/Other  diabetes, Type 2, Oral Hypoglycemic Agents  Renal/GU negative Renal ROS     Musculoskeletal   Abdominal   Peds  Hematology   Anesthesia Other Findings   Reproductive/Obstetrics                             Anesthesia Physical Anesthesia Plan  ASA: III  Anesthesia Plan: General   Post-op Pain Management:    Induction: Intravenous  PONV Risk Score and Plan: 3 and TIVA, Propofol infusion, Midazolam and Treatment may vary due to age or medical condition  Airway Management Planned: Nasal Cannula  Additional Equipment:   Intra-op Plan:   Post-operative Plan:   Informed Consent: I have reviewed the patients History and Physical, chart, labs and discussed the procedure including the risks, benefits and alternatives for the proposed anesthesia with the patient or authorized representative who has indicated his/her understanding and acceptance.     Plan Discussed with:   Anesthesia Plan Comments:         Anesthesia Quick Evaluation

## 2017-09-20 NOTE — Op Note (Signed)
[facility] Gastroenterology Patient Name: Teresa Gibson Procedure Date: 09/20/2017 8:28 AM MRN: [id number] Account #: [id number] Date of Birth: 1933/05/27 Admit Type: Outpatient Age: 84 Room: [facility] ENDO ROOM 1 Gender: Female Note Status: Finalized Procedure:            Upper GI endoscopy Indications:          Iron deficiency anemia, Gastro-esophageal reflux                        disease, Heme positive stool Providers:            Lollie Sails, MD Referring MD:         Warren Danes (Referring MD) Medicines:            Monitored Anesthesia Care Complications:        No immediate complications. Procedure:            Pre-Anesthesia Assessment:                       - ASA Grade Assessment: III - A patient with severe                        systemic disease.                       After obtaining informed consent, the endoscope was                        passed under direct vision. Throughout the procedure,                        the patient's blood pressure, pulse, and oxygen                        saturations were monitored continuously. The Endoscope                        was introduced through the mouth, and advanced to the                        third part of duodenum. The upper GI endoscopy was                        accomplished without difficulty. The patient tolerated                        the procedure well. Findings:      The Z-line was variable. Biopsies were taken with a cold forceps for       histology.      A medium-sized hiatal hernia was found. The Z-line was a variable       distance from incisors; the hiatal hernia was sliding.      The exam of the stomach was otherwise normal.      The cardia and gastric fundus were normal on retroflexion.      A medium non-bleeding diverticulum was found in the second portion of       the duodenum.      small cyst noted in the vallecular space. Impression:           - Z-line variable. Biopsied.                      -  Medium-sized hiatal hernia.                       - Non-bleeding duodenal diverticulum. Recommendation:       - Perform a colonoscopy today.                       - Continue present medications. Procedure Code(s):    --- Professional ---                       [phone], Esophagogastroduodenoscopy, flexible, transoral;                        with biopsy, single or multiple Diagnosis Code(s):    --- Professional ---                       K22.8, Other specified diseases of esophagus                       K44.9, Diaphragmatic hernia without obstruction or                        gangrene                       D50.9, Iron deficiency anemia, unspecified                       K21.9, Gastro-esophageal reflux disease without                        esophagitis                       R19.5, Other fecal abnormalities                       K57.10, Diverticulosis of small intestine without                        perforation or abscess without bleeding CPT copyright 2016 American Medical Association. All rights reserved. The codes documented in this report are preliminary and upon coder review may  be revised to meet current compliance requirements. Lollie Sails, MD 09/20/2017 9:05:24 AM This report has been signed electronically. Number of Addenda: 0 Note Initiated On: 09/20/2017 8:28 AM      [facility]

## 2017-09-20 NOTE — H&P (Signed)
Outpatient short stay form Pre-procedure 09/20/2017 8:24 AM Teresa Sails MD  Primary Physician: Teresa Gibson  Reason for visit:  egd and colonoscopy.   History of present illness:  [name] is a 84 yo female presenting as above.  She has a history of chronic occurring loose stools and  Recent finding of hemoccult positive stool and IDA.  She does take a daily 81 mg aspirin but no other aspirin products or blood thinning agents.  She tolerated her prep well.  He has no dysphagia.  She does take daily meloxicam.   Current Facility-Administered Medications:  .  0.9 %  sodium chloride infusion, , Intravenous, Continuous, Teresa Sails, MD, Last Rate: 20 mL/hr at 09/20/17 0177 .  0.9 %  sodium chloride infusion, , Intravenous, Continuous, Teresa Sails, MD .  0.9 %  sodium chloride infusion, , Intravenous, Continuous, Teresa Sails, MD  Medications Prior to Admission  Medication Sig Dispense Refill Last Dose  . aspirin 81 MG chewable tablet Chew by mouth daily.   Past Week at 0  . atorvastatin (LIPITOR) 10 MG tablet Take 10 mg by mouth daily.   Past Week at Unknown time  . dicyclomine (BENTYL) 10 MG capsule Take 10 mg by mouth 3 (three) times daily as needed for spasms.   Past Week at Unknown time  . gabapentin (NEURONTIN) 100 MG capsule Take 100 mg by mouth at bedtime.   Past Month at Unknown time  . glipiZIDE (GLUCOTROL) 5 MG tablet Take by mouth daily before breakfast.   09/19/2017 at 0830  . hydrochlorothiazide (HYDRODIURIL) 25 MG tablet Take 25 mg by mouth daily.   Past Week at Unknown time  . lisinopril (PRINIVIL,ZESTRIL) 20 MG tablet Take 20 mg by mouth daily.   Past Week at Unknown time  . LORazepam (ATIVAN) 0.5 MG tablet Take 0.5 mg by mouth as needed for anxiety.   Past Month at Unknown time  . meloxicam (MOBIC) 7.5 MG tablet Take 7.5 mg by mouth daily.   Past Week at Unknown time  . metFORMIN (GLUCOPHAGE) 1000 MG tablet Take 1,000 mg by mouth 2 (two) times  daily with a meal.   09/19/2017 at 0830  . metoprolol tartrate (LOPRESSOR) 25 MG tablet Take 25 mg by mouth 2 (two) times daily.   09/19/2017 at 0830  . Multiple Vitamin (MULTIVITAMIN) tablet Take 1 tablet by mouth daily.   Past Week at Unknown time  . Omega-3 1000 MG CAPS Take 1 capsule by mouth daily.   Past Week at Unknown time  . omeprazole (PRILOSEC) 20 MG capsule Take 20 mg by mouth daily.   09/19/2017 at 0830  . rOPINIRole (REQUIP) 0.25 MG tablet Take 0.25 mg by mouth 3 (three) times daily.   Past Week at Unknown time  . sertraline (ZOLOFT) 100 MG tablet Take 100 mg by mouth daily.   Past Week at Unknown time  . traMADol (ULTRAM) 50 MG tablet Take by mouth every 6 (six) hours as needed.   Past Week at Unknown time     Allergies  Allergen Reactions  . Contrast Media [Iodinated Diagnostic Agents]   . Metrizamide      Past Medical History:  Diagnosis Date  . Depression   . Diabetes mellitus without complication ([place])   . GERD (gastroesophageal reflux disease)   . Hyperlipidemia   . Hypertension   . Pain in left hip     Review of systems:      Physical Exam  Heart and lungs: Regular rate and rhythm without rub or gallop, lungs are bilaterally clear.    HEENT: Normocephalic atraumatic eyes are anicteric    Other:    Pertinant exam for procedure: Soft mild tenderness to palpation in the right abdomen, particularly the rlq.  There are no masses or rebound.  Bowel sounds are positive normoactive.  It is of note patient has scars on the anterior abdomen consistent with her history of sigmoid colectomy, ostomy, and reversal.    Planned proceedures: EGD and colonoscopy with indicated procedures. I have discussed the risks benefits and complications of procedures to include not limited to bleeding, infection, perforation and the risk of sedation and the patient wishes to proceed.    Teresa Sails, MD Gastroenterology 09/20/2017  8:24 AM

## 2017-09-20 NOTE — Anesthesia Post-op Follow-up Note (Signed)
Anesthesia QCDR form completed.        

## 2017-09-20 NOTE — Transfer of Care (Signed)
Immediate Anesthesia Transfer of Care Note  Patient: Clinton Bridges Putman  Procedure(s) Performed: ESOPHAGOGASTRODUODENOSCOPY (EGD) WITH PROPOFOL (N/A ) COLONOSCOPY WITH PROPOFOL (N/A )  Patient Location: PACU  Anesthesia Type:General  Level of Consciousness: awake, alert  and patient cooperative  Airway & Oxygen Therapy: Patient Spontanous Breathing  Post-op Assessment: Report given to RN, Post -op Vital signs reviewed and stable and Patient moving all extremities X 4  Post vital signs: Reviewed and stable  Last Vitals:  Vitals:   09/20/17 0805 09/20/17 0939  BP: (!) 165/98 (!) 113/52  Pulse: (!) 111 72  Resp: 16 19  Temp: (!) 35.9 C (!) 36 C  SpO2: 99% 100%    Last Pain:  Vitals:   09/20/17 0939  TempSrc: Tympanic      Patients Stated Pain Goal: 6 (12/30/54 1537)  Complications: No apparent anesthesia complications

## 2017-09-20 NOTE — Op Note (Signed)
[facility] Gastroenterology Patient Name: Teresa Gibson Procedure Date: 09/20/2017 8:28 AM MRN: [id number] Account #: [id number] Date of Birth: 1933-01-15 Admit Type: Outpatient Age: 84 Room: [facility] ENDO ROOM 1 Gender: Female Note Status: Finalized Procedure:            Colonoscopy Indications:          Heme positive stool Providers:            Lollie Sails, MD Referring MD:         Warren Danes (Referring MD) Medicines:            Monitored Anesthesia Care Complications:        No immediate complications. Procedure:            Pre-Anesthesia Assessment:                       - ASA Grade Assessment: III - A patient with severe                        systemic disease.                       After obtaining informed consent, the colonoscope was                        passed under direct vision. Throughout the procedure,                        the patient's blood pressure, pulse, and oxygen                        saturations were monitored continuously. The Olympus                        PCF-H180AL colonoscope ( S#: [id number] ) was introduced                        through the anus and advanced to the the cecum,                        identified by appendiceal orifice and ileocecal valve.                        The colonoscopy was performed without difficulty. The                        patient tolerated the procedure well. The quality of                        the bowel preparation was good. Findings:      A 12 mm polyp was found in the proximal descending colon. The polyp was       sessile. The polyp was removed with a cold snare. Resection and       retrieval were complete.      Three sessile polyps were found in the proximal ascending colon. The       polyps were 2 to 4 mm in size. These polyps were removed with a cold       biopsy forceps. Resection and retrieval were complete.      There was evidence of a prior functional  end-to-end colo-colonic      anastomosis at 7 cm proximal to the anus. This was patent and was       characterized by healthy appearing mucosa. The anastomosis was       traversed. The false limb was normal in appearance.      Many small and large-mouthed diverticula were found in the sigmoid       colon, descending colon, transverse colon and ascending colon.      The perianal exam findings include non-thrombosed external hemorrhoids       and skin tags. Impression:           - One 12 mm polyp in the proximal descending colon,                        removed with a cold snare. Resected and retrieved.                       - Three 2 to 4 mm polyps in the proximal ascending                        colon, removed with a cold biopsy forceps. Resected and                        retrieved.                       - Patent functional end-to-end colo-colonic                        anastomosis, characterized by healthy appearing mucosa.                       - Diverticulosis in the sigmoid colon, in the                        descending colon, in the transverse colon and in the                        ascending colon.                       - Non-thrombosed external hemorrhoids and perianal skin                        tags found on perianal exam. Recommendation:       - Discharge patient to home.                       - Await pathology results.                       - Telephone GI clinic for pathology results in 1 week. Procedure Code(s):    --- Professional ---                       [phone], Colonoscopy, flexible; with removal of tumor(s),                        polyp(s), or other lesion(s) by snare technique                       [id number],  59, Colonoscopy, flexible; with biopsy, single                        or multiple Diagnosis Code(s):    --- Professional ---                       D12.4, Benign neoplasm of descending colon                       D12.2, Benign neoplasm of ascending colon                       Z98.0, Intestinal  bypass and anastomosis status                       K64.4, Residual hemorrhoidal skin tags                       R19.5, Other fecal abnormalities                       K57.30, Diverticulosis of large intestine without                        perforation or abscess without bleeding CPT copyright 2016 [employer]. All rights reserved. The codes documented in this report are preliminary and upon coder review may  be revised to meet current compliance requirements. Lollie Sails, MD 09/20/2017 9:39:33 AM This report has been signed electronically. Number of Addenda: 0 Note Initiated On: 09/20/2017 8:28 AM Scope Withdrawal Time: 0 hours 11 minutes 52 seconds  Total Procedure Duration: 0 hours 23 minutes 54 seconds       [facility]

## 2017-09-21 LAB — SURGICAL PATHOLOGY

## 2017-09-24 ENCOUNTER — Other Ambulatory Visit: Payer: Self-pay | Admitting: Gastroenterology

## 2017-09-24 DIAGNOSIS — R195 Other fecal abnormalities: Secondary | ICD-10-CM

## 2017-09-27 ENCOUNTER — Ambulatory Visit
Admission: RE | Admit: 2017-09-27 | Discharge: 2017-09-27 | Disposition: A | Discharge status: Home / Self Care | Payer: Medicare Other | Source: Ambulatory Visit | Attending: Gastroenterology | Admitting: Gastroenterology

## 2017-09-27 DIAGNOSIS — R195 Other fecal abnormalities: Secondary | ICD-10-CM | POA: Insufficient documentation

## 2018-04-26 ENCOUNTER — Other Ambulatory Visit: Payer: Self-pay | Admitting: Family

## 2018-04-26 DIAGNOSIS — Z1231 Encounter for screening mammogram for malignant neoplasm of breast: Secondary | ICD-10-CM

## 2018-05-27 ENCOUNTER — Encounter (INDEPENDENT_AMBULATORY_CARE_PROVIDER_SITE_OTHER): Payer: Self-pay

## 2018-05-27 ENCOUNTER — Ambulatory Visit
Admission: RE | Admit: 2018-05-27 | Discharge: 2018-05-27 | Disposition: A | Discharge status: Home / Self Care | Payer: Medicare Other | Source: Ambulatory Visit | Attending: Family | Admitting: Family

## 2018-05-27 DIAGNOSIS — Z1231 Encounter for screening mammogram for malignant neoplasm of breast: Secondary | ICD-10-CM | POA: Insufficient documentation

## 2019-04-25 ENCOUNTER — Other Ambulatory Visit: Payer: Self-pay | Admitting: Family

## 2019-04-25 DIAGNOSIS — Z1231 Encounter for screening mammogram for malignant neoplasm of breast: Secondary | ICD-10-CM

## 2019-05-29 ENCOUNTER — Ambulatory Visit: Payer: Medicare Other

## 2019-08-05 ENCOUNTER — Ambulatory Visit
Admission: RE | Admit: 2019-08-05 | Discharge: 2019-08-05 | Disposition: A | Discharge status: Home / Self Care | Payer: Medicare Other | Source: Ambulatory Visit | Attending: Family | Admitting: Family

## 2019-08-05 ENCOUNTER — Encounter (INDEPENDENT_AMBULATORY_CARE_PROVIDER_SITE_OTHER): Payer: Self-pay

## 2019-08-05 ENCOUNTER — Other Ambulatory Visit: Payer: Self-pay

## 2019-08-05 DIAGNOSIS — Z1231 Encounter for screening mammogram for malignant neoplasm of breast: Secondary | ICD-10-CM | POA: Insufficient documentation

## 2020-08-12 DIAGNOSIS — U071 COVID-19: Secondary | ICD-10-CM | POA: Diagnosis not present

## 2020-08-12 DIAGNOSIS — J029 Acute pharyngitis, unspecified: Secondary | ICD-10-CM | POA: Diagnosis not present

## 2020-08-13 DIAGNOSIS — R059 Cough, unspecified: Secondary | ICD-10-CM | POA: Diagnosis not present

## 2020-08-13 DIAGNOSIS — R5381 Other malaise: Secondary | ICD-10-CM | POA: Diagnosis not present

## 2020-08-13 DIAGNOSIS — U071 COVID-19: Secondary | ICD-10-CM | POA: Diagnosis not present

## 2020-08-13 DIAGNOSIS — R197 Diarrhea, unspecified: Secondary | ICD-10-CM | POA: Diagnosis not present

## 2020-08-20 DIAGNOSIS — M25521 Pain in right elbow: Secondary | ICD-10-CM | POA: Diagnosis not present

## 2020-08-20 DIAGNOSIS — W19XXXA Unspecified fall, initial encounter: Secondary | ICD-10-CM | POA: Diagnosis not present

## 2020-08-20 DIAGNOSIS — M71521 Other bursitis, not elsewhere classified, right elbow: Secondary | ICD-10-CM | POA: Diagnosis not present

## 2020-08-20 DIAGNOSIS — M79631 Pain in right forearm: Secondary | ICD-10-CM | POA: Diagnosis not present

## 2020-08-27 DIAGNOSIS — Z09 Encounter for follow-up examination after completed treatment for conditions other than malignant neoplasm: Secondary | ICD-10-CM | POA: Diagnosis not present

## 2020-11-03 DIAGNOSIS — Z794 Long term (current) use of insulin: Secondary | ICD-10-CM | POA: Diagnosis not present

## 2020-11-03 DIAGNOSIS — I10 Essential (primary) hypertension: Secondary | ICD-10-CM | POA: Diagnosis not present

## 2020-11-03 DIAGNOSIS — R350 Frequency of micturition: Secondary | ICD-10-CM | POA: Diagnosis not present

## 2020-11-03 DIAGNOSIS — E1110 Type 2 diabetes mellitus with ketoacidosis without coma: Secondary | ICD-10-CM | POA: Diagnosis not present

## 2020-12-18 DIAGNOSIS — E785 Hyperlipidemia, unspecified: Secondary | ICD-10-CM | POA: Diagnosis not present

## 2020-12-18 DIAGNOSIS — Z7983 Long term (current) use of bisphosphonates: Secondary | ICD-10-CM | POA: Diagnosis not present

## 2020-12-18 DIAGNOSIS — E119 Type 2 diabetes mellitus without complications: Secondary | ICD-10-CM | POA: Diagnosis not present

## 2020-12-18 DIAGNOSIS — Z91041 Radiographic dye allergy status: Secondary | ICD-10-CM | POA: Diagnosis not present

## 2020-12-18 DIAGNOSIS — Z7982 Long term (current) use of aspirin: Secondary | ICD-10-CM | POA: Diagnosis not present

## 2020-12-18 DIAGNOSIS — R197 Diarrhea, unspecified: Secondary | ICD-10-CM | POA: Diagnosis not present

## 2020-12-18 DIAGNOSIS — Z7984 Long term (current) use of oral hypoglycemic drugs: Secondary | ICD-10-CM | POA: Diagnosis not present

## 2020-12-18 DIAGNOSIS — K56609 Unspecified intestinal obstruction, unspecified as to partial versus complete obstruction: Secondary | ICD-10-CM | POA: Diagnosis not present

## 2020-12-18 DIAGNOSIS — N281 Cyst of kidney, acquired: Secondary | ICD-10-CM | POA: Diagnosis not present

## 2020-12-18 DIAGNOSIS — Z9049 Acquired absence of other specified parts of digestive tract: Secondary | ICD-10-CM | POA: Diagnosis not present

## 2020-12-18 DIAGNOSIS — I10 Essential (primary) hypertension: Secondary | ICD-10-CM | POA: Diagnosis not present

## 2020-12-18 DIAGNOSIS — Z888 Allergy status to other drugs, medicaments and biological substances status: Secondary | ICD-10-CM | POA: Diagnosis not present

## 2020-12-18 DIAGNOSIS — M549 Dorsalgia, unspecified: Secondary | ICD-10-CM | POA: Diagnosis not present

## 2020-12-18 DIAGNOSIS — Z20822 Contact with and (suspected) exposure to covid-19: Secondary | ICD-10-CM | POA: Diagnosis not present

## 2020-12-18 DIAGNOSIS — R1031 Right lower quadrant pain: Secondary | ICD-10-CM | POA: Diagnosis not present

## 2020-12-18 DIAGNOSIS — K5792 Diverticulitis of intestine, part unspecified, without perforation or abscess without bleeding: Secondary | ICD-10-CM | POA: Diagnosis not present

## 2020-12-18 DIAGNOSIS — Z794 Long term (current) use of insulin: Secondary | ICD-10-CM | POA: Diagnosis not present

## 2020-12-18 DIAGNOSIS — R9431 Abnormal electrocardiogram [ECG] [EKG]: Secondary | ICD-10-CM | POA: Diagnosis not present

## 2020-12-18 DIAGNOSIS — K219 Gastro-esophageal reflux disease without esophagitis: Secondary | ICD-10-CM | POA: Diagnosis not present

## 2020-12-18 DIAGNOSIS — K449 Diaphragmatic hernia without obstruction or gangrene: Secondary | ICD-10-CM | POA: Diagnosis not present

## 2020-12-18 DIAGNOSIS — Z87891 Personal history of nicotine dependence: Secondary | ICD-10-CM | POA: Diagnosis not present

## 2020-12-19 DIAGNOSIS — E119 Type 2 diabetes mellitus without complications: Secondary | ICD-10-CM | POA: Diagnosis not present

## 2020-12-19 DIAGNOSIS — R944 Abnormal results of kidney function studies: Secondary | ICD-10-CM | POA: Diagnosis not present

## 2020-12-19 DIAGNOSIS — R1084 Generalized abdominal pain: Secondary | ICD-10-CM | POA: Diagnosis not present

## 2020-12-20 DIAGNOSIS — K5669 Other partial intestinal obstruction: Secondary | ICD-10-CM | POA: Diagnosis not present

## 2020-12-20 DIAGNOSIS — R634 Abnormal weight loss: Secondary | ICD-10-CM | POA: Diagnosis not present

## 2020-12-20 DIAGNOSIS — R1031 Right lower quadrant pain: Secondary | ICD-10-CM | POA: Diagnosis not present

## 2020-12-21 DIAGNOSIS — R1084 Generalized abdominal pain: Secondary | ICD-10-CM | POA: Diagnosis not present

## 2020-12-21 DIAGNOSIS — R634 Abnormal weight loss: Secondary | ICD-10-CM | POA: Diagnosis not present

## 2020-12-21 DIAGNOSIS — K5669 Other partial intestinal obstruction: Secondary | ICD-10-CM | POA: Diagnosis not present

## 2021-01-05 DIAGNOSIS — Z794 Long term (current) use of insulin: Secondary | ICD-10-CM | POA: Diagnosis not present

## 2021-01-05 DIAGNOSIS — E1110 Type 2 diabetes mellitus with ketoacidosis without coma: Secondary | ICD-10-CM | POA: Diagnosis not present

## 2021-01-05 DIAGNOSIS — I10 Essential (primary) hypertension: Secondary | ICD-10-CM | POA: Diagnosis not present

## 2021-01-07 DIAGNOSIS — Z09 Encounter for follow-up examination after completed treatment for conditions other than malignant neoplasm: Secondary | ICD-10-CM | POA: Diagnosis not present

## 2021-01-07 DIAGNOSIS — E1110 Type 2 diabetes mellitus with ketoacidosis without coma: Secondary | ICD-10-CM | POA: Diagnosis not present

## 2021-01-07 DIAGNOSIS — Z8719 Personal history of other diseases of the digestive system: Secondary | ICD-10-CM | POA: Diagnosis not present

## 2021-01-07 DIAGNOSIS — I10 Essential (primary) hypertension: Secondary | ICD-10-CM | POA: Diagnosis not present

## 2021-03-01 DIAGNOSIS — E119 Type 2 diabetes mellitus without complications: Secondary | ICD-10-CM | POA: Diagnosis not present

## 2021-03-01 DIAGNOSIS — N3281 Overactive bladder: Secondary | ICD-10-CM | POA: Diagnosis not present

## 2021-03-13 IMAGING — MG DIGITAL SCREENING BILAT W/ TOMO W/ CAD
6 of 12 series · 6 of 36 positions shown · non-contrast
Comparison: Previous exam(s).

CLINICAL DATA: Screening.

EXAM:
DIGITAL SCREENING BILATERAL MAMMOGRAM WITH TOMO AND CAD

[L MLO synth-2D (1 of 2)]
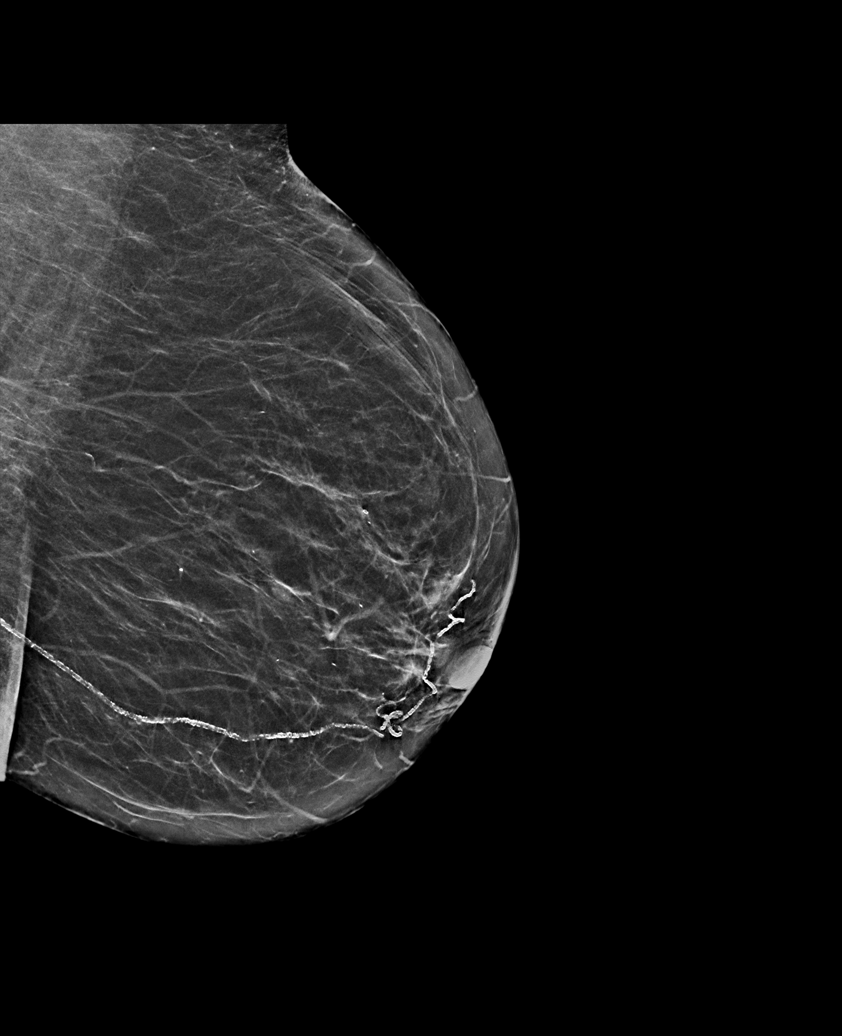

[R CC synth-2D]
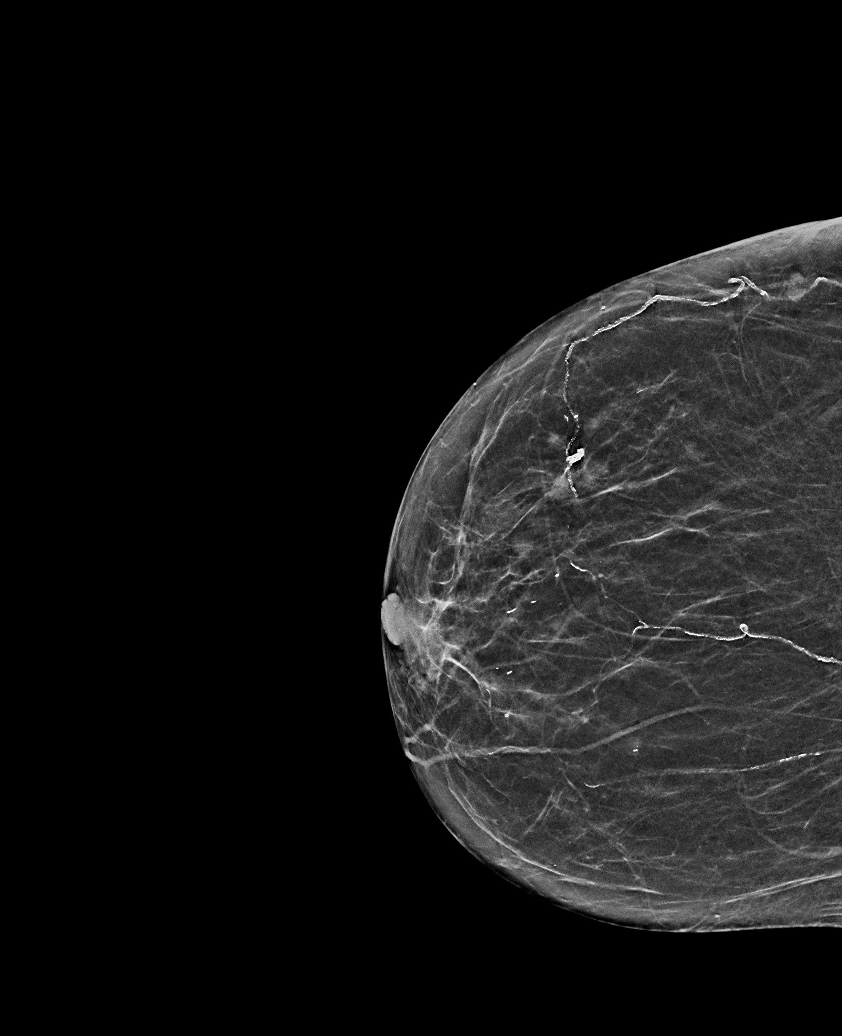

[L CC synth-2D]
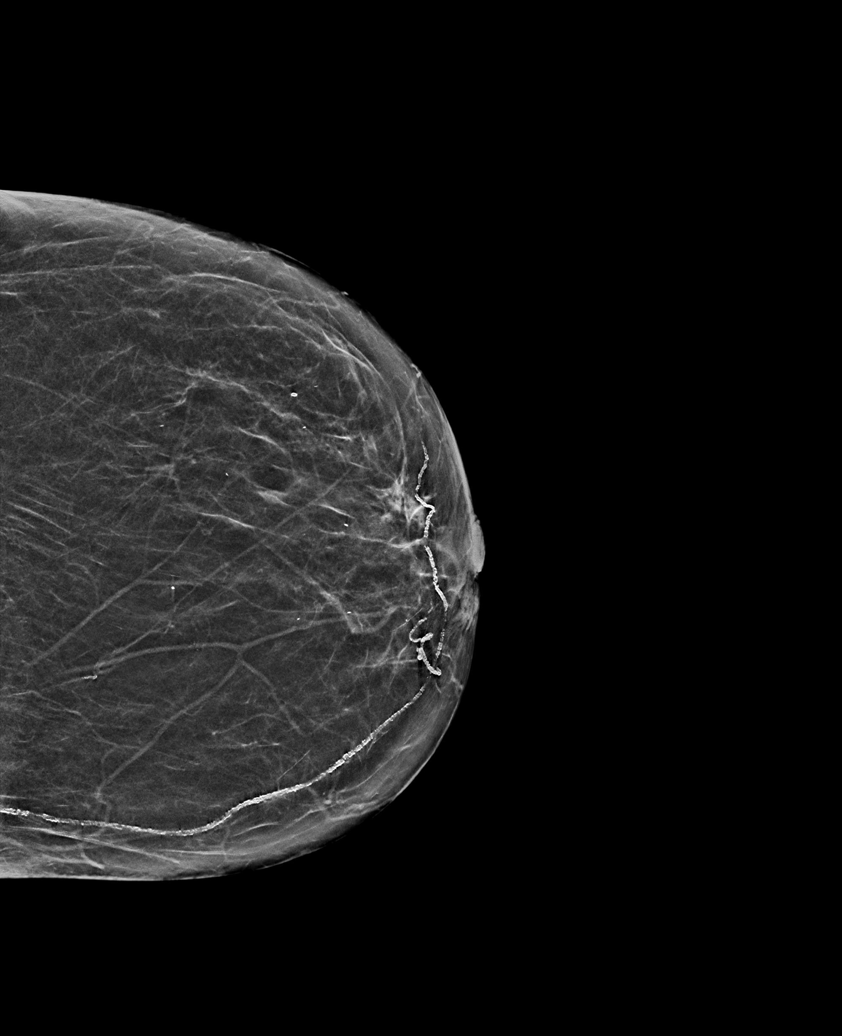

[L MLO synth-2D (2 of 2)]
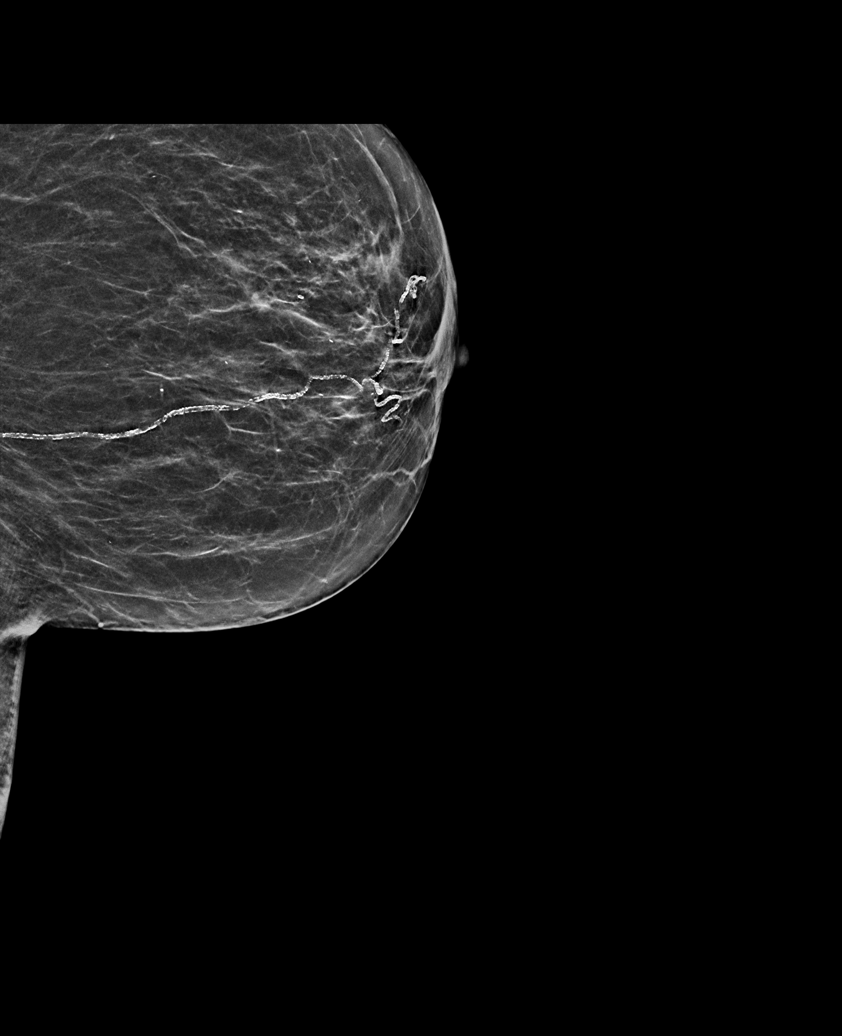

[R MLO synth-2D (1 of 2)]
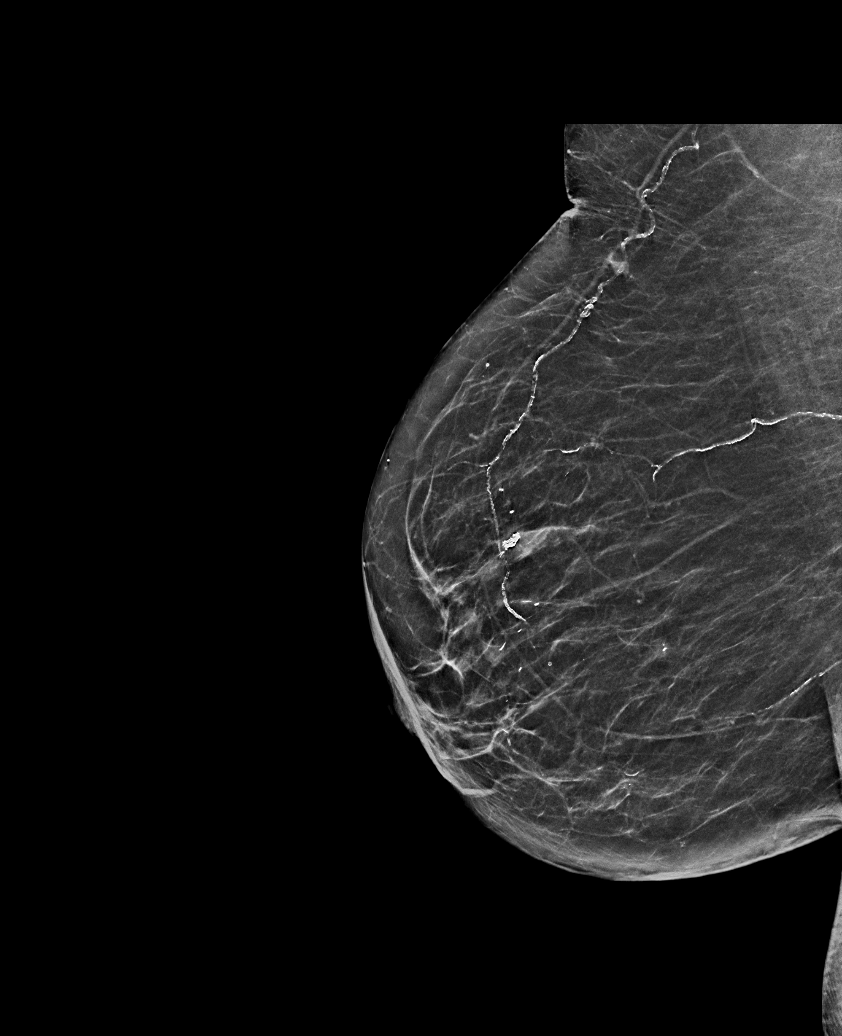

[R MLO synth-2D (2 of 2)]
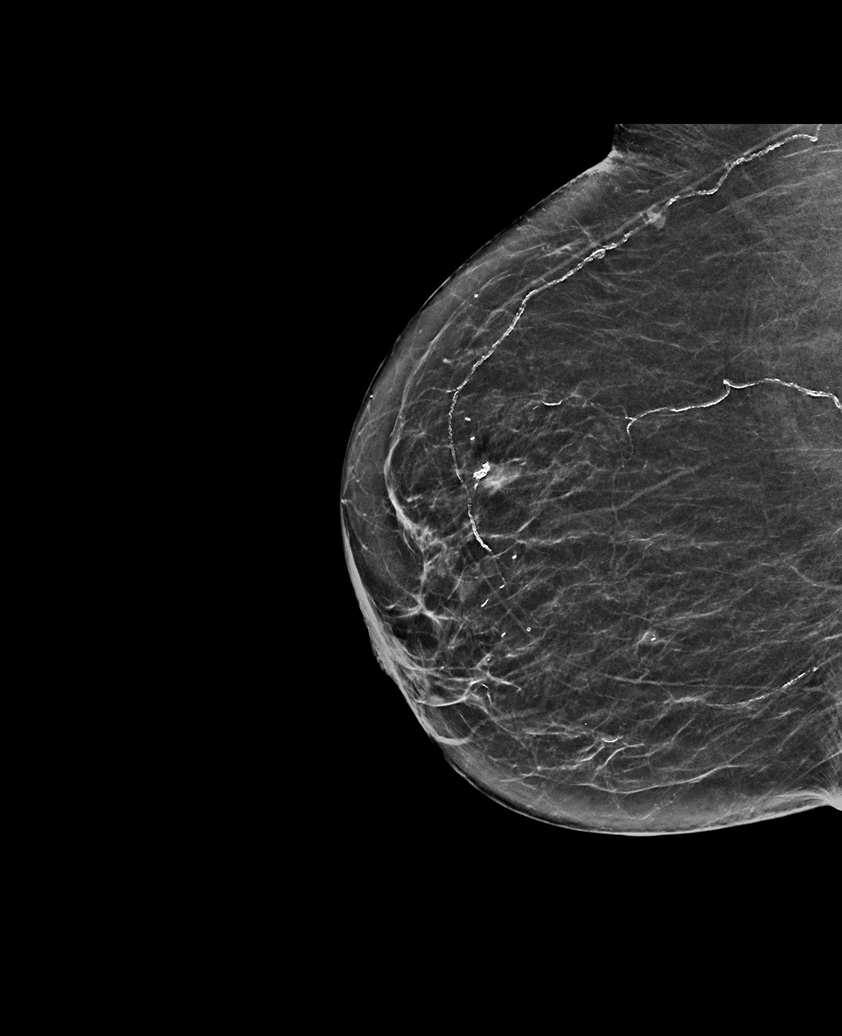

[6 of 36 positions shown; findings below may reference images not displayed]

ACR Breast Density Category b: There are scattered areas of
fibroglandular density.
FINDINGS: There are no findings suspicious for malignancy. Images were
processed with CAD.
IMPRESSION: No mammographic evidence of malignancy. A result letter of this
screening mammogram will be mailed directly to the patient.

RECOMMENDATION:
Screening mammogram in one year. (Code:[id number]-775)

BI-RADS CATEGORY  1: Negative.

## 2021-03-17 DIAGNOSIS — I10 Essential (primary) hypertension: Secondary | ICD-10-CM | POA: Diagnosis not present

## 2021-03-17 DIAGNOSIS — E1110 Type 2 diabetes mellitus with ketoacidosis without coma: Secondary | ICD-10-CM | POA: Diagnosis not present

## 2021-03-21 DIAGNOSIS — R350 Frequency of micturition: Secondary | ICD-10-CM | POA: Diagnosis not present

## 2021-04-12 DIAGNOSIS — H40013 Open angle with borderline findings, low risk, bilateral: Secondary | ICD-10-CM | POA: Diagnosis not present

## 2021-04-12 DIAGNOSIS — H26491 Other secondary cataract, right eye: Secondary | ICD-10-CM | POA: Diagnosis not present

## 2021-04-12 DIAGNOSIS — E119 Type 2 diabetes mellitus without complications: Secondary | ICD-10-CM | POA: Diagnosis not present

## 2021-04-12 DIAGNOSIS — H40053 Ocular hypertension, bilateral: Secondary | ICD-10-CM | POA: Diagnosis not present

## 2021-04-21 DIAGNOSIS — R350 Frequency of micturition: Secondary | ICD-10-CM | POA: Diagnosis not present

## 2021-04-25 DIAGNOSIS — R350 Frequency of micturition: Secondary | ICD-10-CM | POA: Diagnosis not present

## 2021-05-02 DIAGNOSIS — R350 Frequency of micturition: Secondary | ICD-10-CM | POA: Diagnosis not present

## 2021-05-20 DIAGNOSIS — Z23 Encounter for immunization: Secondary | ICD-10-CM | POA: Diagnosis not present

## 2021-05-20 DIAGNOSIS — M25512 Pain in left shoulder: Secondary | ICD-10-CM | POA: Diagnosis not present

## 2021-06-30 DIAGNOSIS — E119 Type 2 diabetes mellitus without complications: Secondary | ICD-10-CM | POA: Diagnosis not present

## 2021-07-22 DIAGNOSIS — H903 Sensorineural hearing loss, bilateral: Secondary | ICD-10-CM | POA: Diagnosis not present

## 2021-07-22 DIAGNOSIS — H6982 Other specified disorders of Eustachian tube, left ear: Secondary | ICD-10-CM | POA: Diagnosis not present

## 2021-08-09 DIAGNOSIS — I129 Hypertensive chronic kidney disease with stage 1 through stage 4 chronic kidney disease, or unspecified chronic kidney disease: Secondary | ICD-10-CM | POA: Diagnosis not present

## 2021-08-09 DIAGNOSIS — R350 Frequency of micturition: Secondary | ICD-10-CM | POA: Diagnosis not present

## 2021-08-09 DIAGNOSIS — E1122 Type 2 diabetes mellitus with diabetic chronic kidney disease: Secondary | ICD-10-CM | POA: Diagnosis not present

## 2021-08-09 DIAGNOSIS — N3281 Overactive bladder: Secondary | ICD-10-CM | POA: Diagnosis not present

## 2021-08-09 DIAGNOSIS — N189 Chronic kidney disease, unspecified: Secondary | ICD-10-CM | POA: Diagnosis not present

## 2021-09-02 DIAGNOSIS — M810 Age-related osteoporosis without current pathological fracture: Secondary | ICD-10-CM | POA: Diagnosis not present

## 2021-09-02 DIAGNOSIS — E119 Type 2 diabetes mellitus without complications: Secondary | ICD-10-CM | POA: Diagnosis not present

## 2021-09-12 DIAGNOSIS — R350 Frequency of micturition: Secondary | ICD-10-CM | POA: Diagnosis not present

## 2021-09-12 DIAGNOSIS — N3281 Overactive bladder: Secondary | ICD-10-CM | POA: Diagnosis not present

## 2021-10-10 DIAGNOSIS — E119 Type 2 diabetes mellitus without complications: Secondary | ICD-10-CM | POA: Diagnosis not present

## 2021-10-10 DIAGNOSIS — I10 Essential (primary) hypertension: Secondary | ICD-10-CM | POA: Diagnosis not present

## 2021-11-15 DIAGNOSIS — N3281 Overactive bladder: Secondary | ICD-10-CM | POA: Diagnosis not present

## 2021-11-15 DIAGNOSIS — N3081 Other cystitis with hematuria: Secondary | ICD-10-CM | POA: Diagnosis not present

## 2021-11-15 DIAGNOSIS — R350 Frequency of micturition: Secondary | ICD-10-CM | POA: Diagnosis not present

## 2021-11-25 DIAGNOSIS — I10 Essential (primary) hypertension: Secondary | ICD-10-CM | POA: Diagnosis not present

## 2021-11-25 DIAGNOSIS — M810 Age-related osteoporosis without current pathological fracture: Secondary | ICD-10-CM | POA: Diagnosis not present

## 2021-11-25 DIAGNOSIS — E119 Type 2 diabetes mellitus without complications: Secondary | ICD-10-CM | POA: Diagnosis not present

## 2021-11-25 DIAGNOSIS — Z794 Long term (current) use of insulin: Secondary | ICD-10-CM | POA: Diagnosis not present

## 2021-12-28 DIAGNOSIS — E785 Hyperlipidemia, unspecified: Secondary | ICD-10-CM | POA: Diagnosis not present

## 2021-12-28 DIAGNOSIS — E119 Type 2 diabetes mellitus without complications: Secondary | ICD-10-CM | POA: Diagnosis not present

## 2021-12-28 DIAGNOSIS — I10 Essential (primary) hypertension: Secondary | ICD-10-CM | POA: Diagnosis not present

## 2021-12-28 DIAGNOSIS — Z794 Long term (current) use of insulin: Secondary | ICD-10-CM | POA: Diagnosis not present

## 2022-01-08 DIAGNOSIS — Z87891 Personal history of nicotine dependence: Secondary | ICD-10-CM | POA: Diagnosis not present

## 2022-01-08 DIAGNOSIS — E785 Hyperlipidemia, unspecified: Secondary | ICD-10-CM | POA: Diagnosis not present

## 2022-01-08 DIAGNOSIS — E1110 Type 2 diabetes mellitus with ketoacidosis without coma: Secondary | ICD-10-CM | POA: Diagnosis not present

## 2022-01-08 DIAGNOSIS — R000 Tachycardia, unspecified: Secondary | ICD-10-CM | POA: Diagnosis not present

## 2022-01-08 DIAGNOSIS — K449 Diaphragmatic hernia without obstruction or gangrene: Secondary | ICD-10-CM | POA: Diagnosis not present

## 2022-01-08 DIAGNOSIS — K56609 Unspecified intestinal obstruction, unspecified as to partial versus complete obstruction: Secondary | ICD-10-CM | POA: Diagnosis not present

## 2022-01-08 DIAGNOSIS — R093 Abnormal sputum: Secondary | ICD-10-CM | POA: Diagnosis not present

## 2022-01-08 DIAGNOSIS — R0602 Shortness of breath: Secondary | ICD-10-CM | POA: Diagnosis not present

## 2022-01-08 DIAGNOSIS — R0789 Other chest pain: Secondary | ICD-10-CM | POA: Diagnosis not present

## 2022-01-08 DIAGNOSIS — R059 Cough, unspecified: Secondary | ICD-10-CM | POA: Diagnosis not present

## 2022-01-08 DIAGNOSIS — I10 Essential (primary) hypertension: Secondary | ICD-10-CM | POA: Diagnosis not present

## 2022-01-08 DIAGNOSIS — R197 Diarrhea, unspecified: Secondary | ICD-10-CM | POA: Diagnosis not present

## 2022-01-08 DIAGNOSIS — R0600 Dyspnea, unspecified: Secondary | ICD-10-CM | POA: Diagnosis not present

## 2022-01-08 DIAGNOSIS — R1013 Epigastric pain: Secondary | ICD-10-CM | POA: Diagnosis not present

## 2022-01-08 DIAGNOSIS — R079 Chest pain, unspecified: Secondary | ICD-10-CM | POA: Diagnosis not present

## 2022-01-09 DIAGNOSIS — R070 Pain in throat: Secondary | ICD-10-CM | POA: Diagnosis not present

## 2022-01-09 DIAGNOSIS — J069 Acute upper respiratory infection, unspecified: Secondary | ICD-10-CM | POA: Diagnosis not present

## 2022-01-09 DIAGNOSIS — R062 Wheezing: Secondary | ICD-10-CM | POA: Diagnosis not present

## 2022-01-18 DIAGNOSIS — Z794 Long term (current) use of insulin: Secondary | ICD-10-CM | POA: Diagnosis not present

## 2022-01-18 DIAGNOSIS — J329 Chronic sinusitis, unspecified: Secondary | ICD-10-CM | POA: Diagnosis not present

## 2022-01-18 DIAGNOSIS — E119 Type 2 diabetes mellitus without complications: Secondary | ICD-10-CM | POA: Diagnosis not present

## 2022-01-18 DIAGNOSIS — I10 Essential (primary) hypertension: Secondary | ICD-10-CM | POA: Diagnosis not present

## 2022-01-18 DIAGNOSIS — J0100 Acute maxillary sinusitis, unspecified: Secondary | ICD-10-CM | POA: Diagnosis not present
